# Patient Record
Sex: Male | Born: 1965 | Race: White | Hispanic: No | Marital: Married | Smoking: Current every day smoker
Health system: Southern US, Community
[De-identification: ages and names within clinical notes are randomized; demographics above are authoritative.]

## PROBLEM LIST (undated history)

## (undated) DIAGNOSIS — F329 Major depressive disorder, single episode, unspecified: Secondary | ICD-10-CM

## (undated) DIAGNOSIS — F32A Depression, unspecified: Secondary | ICD-10-CM

## (undated) HISTORY — DX: Major depressive disorder, single episode, unspecified: F32.9

## (undated) HISTORY — PX: OTHER SURGICAL HISTORY: SHX169

## (undated) HISTORY — DX: Depression, unspecified: F32.A

---

## 2006-08-03 ENCOUNTER — Ambulatory Visit: Payer: Self-pay | Admitting: Family Medicine

## 2006-10-02 DIAGNOSIS — F17200 Nicotine dependence, unspecified, uncomplicated: Secondary | ICD-10-CM

## 2009-10-15 ENCOUNTER — Ambulatory Visit: Payer: Self-pay | Admitting: Family Medicine

## 2009-10-15 ENCOUNTER — Encounter: Payer: Self-pay | Admitting: Family Medicine

## 2009-10-15 DIAGNOSIS — F3289 Other specified depressive episodes: Secondary | ICD-10-CM | POA: Insufficient documentation

## 2009-10-15 DIAGNOSIS — F329 Major depressive disorder, single episode, unspecified: Secondary | ICD-10-CM | POA: Insufficient documentation

## 2009-11-16 ENCOUNTER — Ambulatory Visit: Payer: Self-pay | Admitting: Family Medicine

## 2012-09-13 ENCOUNTER — Ambulatory Visit (INDEPENDENT_AMBULATORY_CARE_PROVIDER_SITE_OTHER): Payer: BC Managed Care – PPO | Admitting: Family Medicine

## 2012-09-13 ENCOUNTER — Encounter: Payer: Self-pay | Admitting: Family Medicine

## 2012-09-13 VITALS — BP 150/97 | HR 93 | Wt 201.0 lb

## 2012-09-13 DIAGNOSIS — F17200 Nicotine dependence, unspecified, uncomplicated: Secondary | ICD-10-CM

## 2012-09-13 DIAGNOSIS — Z23 Encounter for immunization: Secondary | ICD-10-CM

## 2012-09-13 DIAGNOSIS — Z720 Tobacco use: Secondary | ICD-10-CM

## 2012-09-13 DIAGNOSIS — Z0000 Encounter for general adult medical examination without abnormal findings: Secondary | ICD-10-CM

## 2012-09-13 DIAGNOSIS — R030 Elevated blood-pressure reading, without diagnosis of hypertension: Secondary | ICD-10-CM

## 2012-09-13 MED ORDER — TRIAMCINOLONE ACETONIDE 0.1 % EX CREA: TOPICAL_CREAM | Freq: Every day | CUTANEOUS | Status: DC | PRN

## 2012-09-13 MED ORDER — VARENICLINE TARTRATE 0.5 MG X 11 & 1 MG X 42 PO MISC: ORAL | Status: DC

## 2012-09-13 MED ORDER — TRIAMCINOLONE ACETONIDE 0.1 % EX CREA: TOPICAL_CREAM | Freq: Two times a day (BID) | CUTANEOUS | Status: DC

## 2012-09-13 NOTE — Progress Notes (Signed)
Subjective:    Patient ID: Paul Osborn, male    DOB: 10-Jul-1966, 46 y.o.   MRN: [id number]  HPI Here for CPE.  No complaints would like to restart Chantix. Started it years ago but didn't finish it.  Tolerated it well.    Review of Systems Comprehensive review of systems is negative.  BP 150/97  Pulse 93  Wt 201 lb (91.173 kg)    No Known Allergies  No past medical history on file.  Past Surgical History  Procedure Date  . Correction of overbite     as a child    History   Social History  . Marital Status: Single    Spouse Name: Idell Pickles     Number of Children: 2  . Years of Education: N/A   Occupational History  . Manager     [employer]   Social History Main Topics  . Smoking status: Current Every Day Smoker -- 1.0 packs/day  . Smokeless tobacco: Not on file  . Alcohol Use: Yes     rare  . Drug Use: No  . Sexually Active: Yes -- Male partner(s)   Other Topics Concern  . Not on file   Social History Narrative   No regular exercise.     Family History  Problem Relation Age of Onset  . Hyperlipidemia Mother   . Breast cancer Mother   . Hypertension Father   . Stroke Father   . Diabetes Father     Outpatient Encounter Prescriptions as of 09/13/2012  Medication Sig Dispense Refill  . triamcinolone cream (KENALOG) 0.1 % Apply topically daily as needed.  30 g  0  . varenicline (CHANTIX STARTING MONTH PAK) 0.5 MG X 11 & 1 MG X 42 tablet Take one 0.5 mg tablet by mouth once daily for 3 days, then increase to one 0.5 mg tablet twice daily for 4 days, then increase to one 1 mg tablet twice daily.  53 tablet  0  . DISCONTD: triamcinolone cream (KENALOG) 0.1 % Apply topically 2 (two) times daily.  30 g  0          Objective:   Physical Exam  Constitutional: He is oriented to person, place, and time. He appears well-developed and well-nourished.  HENT:  Head: Normocephalic and atraumatic.  Right Ear: External ear normal.  Left Ear: External ear  normal.  Nose: Nose normal.  Mouth/Throat: Oropharynx is clear and moist.  Eyes: Conjunctivae normal and EOM are normal. Pupils are equal, round, and reactive to light.  Neck: Normal range of motion. Neck supple. No thyromegaly present.       Thyroid is slighty asymmetric.  Larger on the right compared to left.   Cardiovascular: Normal rate, regular rhythm, normal heart sounds and intact distal pulses.        No carotid bruits/   Pulmonary/Chest: Effort normal and breath sounds normal.  Abdominal: Soft. Bowel sounds are normal. He exhibits no distension and no mass. There is no tenderness. There is no rebound and no guarding.  Musculoskeletal: Normal range of motion.  Lymphadenopathy:    He has no cervical adenopathy.  Neurological: He is alert and oriented to person, place, and time. He has normal reflexes.  Skin: Skin is warm and dry.  Psychiatric: He has a normal mood and affect. His behavior is normal. Judgment and thought content normal.          Assessment & Plan:  CPE Start a regular exercise program and make sure you  are eating a healthy diet Try to eat 4 servings of dairy a day   Tobacco Abuse- will start Chantix. Discussed potential SE. Call if any concerne. Try to complete full 3 months.  Encouraged to use quit program.    Tdap given today.   Elevated blood pressure-his blood pressure is high today. He does have a family history. We discussed eating low salt diet working on regular exercise and weight loss. Followup in 3-4 weeks to repeat blood pressure. At that time if still elevated we discussed that we would need to start a medication.Marland Kitchen Hopefully working on diet and exercise will improve this. Also discussed avoid NSAIDs and caffeine as much as possible as well as decongestants which can also increase blood pressure.

## 2012-09-13 NOTE — Patient Instructions (Addendum)
DASH Diet The DASH diet stands for "Dietary Approaches to Stop Hypertension." It is a healthy eating plan that has been shown to reduce high blood pressure (hypertension) in as little as 14 days, while also possibly providing other significant health benefits. These other health benefits include reducing the risk of breast cancer after menopause and reducing the risk of type 2 diabetes, heart disease, colon cancer, and stroke. Health benefits also include weight loss and slowing kidney failure in patients with chronic kidney disease.   DIET GUIDELINES  Limit salt (sodium). Your diet should contain less than 1500 mg of sodium daily.   Limit refined or processed carbohydrates. Your diet should include mostly whole grains. Desserts and added sugars should be used sparingly.   Include small amounts of heart-healthy fats. These types of fats include nuts, oils, and tub margarine. Limit saturated and trans fats. These fats have been shown to be harmful in the body.  CHOOSING FOODS   The following food groups are based on a 2000 calorie diet. See your Registered Dietitian for individual calorie needs. Grains and Grain Products (6 to 8 servings daily)  Eat More Often: Whole-wheat bread, brown rice, whole-grain or wheat pasta, quinoa, popcorn without added fat or salt (air popped).   Eat Less Often: White bread, white pasta, white rice, cornbread.  Vegetables (4 to 5 servings daily)  Eat More Often: Fresh, frozen, and canned vegetables. Vegetables may be raw, steamed, roasted, or grilled with a minimal amount of fat.   Eat Less Often/Avoid: Creamed or fried vegetables. Vegetables in a cheese sauce.  Fruit (4 to 5 servings daily)  Eat More Often: All fresh, canned (in natural juice), or frozen fruits. Dried fruits without added sugar. One hundred percent fruit juice ( cup [237 mL] daily).   Eat Less Often: Dried fruits with added sugar. Canned fruit in light or heavy syrup.  Lean Meats, Fish, and  Poultry (2 servings or less daily. One serving is 3 to 4 oz [85-114 g]).  Eat More Often: Ninety percent or leaner ground beef, tenderloin, sirloin. Round cuts of beef, chicken breast, turkey breast. All fish. Grill, bake, or broil your meat. Nothing should be fried.   Eat Less Often/Avoid: Fatty cuts of meat, turkey, or chicken leg, thigh, or wing. Fried cuts of meat or fish.  Dairy (2 to 3 servings)  Eat More Often: Low-fat or fat-free milk, low-fat plain or light yogurt, reduced-fat or part-skim cheese.   Eat Less Often/Avoid: Milk (whole, 2%, skim, or chocolate). Whole milk yogurt. Full-fat cheeses.  Nuts, Seeds, and Legumes (4 to 5 servings per week)  Eat More Often: All without added salt.   Eat Less Often/Avoid: Salted nuts and seeds, canned beans with added salt.  Fats and Sweets (limited)  Eat More Often: Vegetable oils, tub margarines without trans fats, sugar-free gelatin. Mayonnaise and salad dressings.   Eat Less Often/Avoid: Coconut oils, palm oils, butter, stick margarine, cream, half and half, cookies, candy, pie.  FOR MORE INFORMATION The Dash Diet Eating Plan: www.dashdiet.org Document Released: 11/30/2011 Document Reviewed: 11/20/2011 ExitCare Patient Information 2012 [employer].1.5 Gram Low Sodium Diet A 1.5 gram sodium diet restricts the amount of sodium in the diet to no more than 1.5 g or 1500 mg daily. The American Heart Association recommends Americans over the age of 20 to consume no more than 1500 mg of sodium each day to reduce the risk of developing high blood pressure. Research also shows that limiting sodium may reduce heart attack   and stroke risk. Many foods contain sodium for flavor and sometimes as a preservative. When the amount of sodium in a diet needs to be low, it is important to know what to look for when choosing foods and drinks. The following includes some information and guidelines to help make it easier for you to adapt to a low sodium  diet. QUICK TIPS  Do not add salt to food.   Avoid convenience items and fast food.   Choose unsalted snack foods.   Buy lower sodium products, often labeled as "lower sodium" or "no salt added."   Check food labels to learn how much sodium is in 1 serving.   When eating at a restaurant, ask that your food be prepared with less salt or none, if possible.  READING FOOD LABELS FOR SODIUM INFORMATION The nutrition facts label is a good place to find how much sodium is in foods. Look for products with no more than 400 mg of sodium per serving. Remember that 1.5 g = 1500 mg. The food label may also list foods as:  Sodium-free: Less than 5 mg in a serving.   Very low sodium: 35 mg or less in a serving.   Low-sodium: 140 mg or less in a serving.   Light in sodium: 50% less sodium in a serving. For example, if a food that usually has 300 mg of sodium is changed to become light in sodium, it will have 150 mg of sodium.   Reduced sodium: 25% less sodium in a serving. For example, if a food that usually has 400 mg of sodium is changed to reduced sodium, it will have 300 mg of sodium.  CHOOSING FOODS Grains  Avoid: Salted crackers and snack items. Some cereals, including instant hot cereals. Bread stuffing and biscuit mixes. Seasoned rice or pasta mixes.   Choose: Unsalted snack items. Low-sodium cereals, oats, puffed wheat and rice, shredded wheat. English muffins and bread. Pasta.  Meats  Avoid: Salted, canned, smoked, spiced, pickled meats, including fish and poultry. Bacon, ham, sausage, cold cuts, hot dogs, anchovies.   Choose: Low-sodium canned tuna and salmon. Fresh or frozen meat, poultry, and fish.  Dairy  Avoid: Processed cheese and spreads. Cottage cheese. Buttermilk and condensed milk. Regular cheese.   Choose: Milk. Low-sodium cottage cheese. Yogurt. Sour cream. Low-sodium cheese.  Fruits and Vegetables  Avoid: Regular canned vegetables. Regular canned tomato sauce and  paste. Frozen vegetables in sauces. Olives. Pickles. Relishes. Sauerkraut.   Choose: Low-sodium canned vegetables. Low-sodium tomato sauce and paste. Frozen or fresh vegetables. Fresh and frozen fruit.  Condiments  Avoid: Canned and packaged gravies. Worcestershire sauce. Tartar sauce. Barbecue sauce. Soy sauce. Steak sauce. Ketchup. Onion, garlic, and table salt. Meat flavorings and tenderizers.   Choose: Fresh and dried herbs and spices. Low-sodium varieties of mustard and ketchup. Lemon juice. Tabasco sauce. Horseradish.  SAMPLE 1.5 GRAM SODIUM MEAL PLAN Breakfast / Sodium (mg)  1 cup low-fat milk / 143 mg   1 whole-wheat English muffin / 240 mg   1 tbs heart-healthy margarine / 153 mg   1 hard-boiled egg / 139 mg   1 small orange / 0 mg  Lunch / Sodium (mg)  1 cup raw carrots / 76 mg   2 tbs no salt added peanut butter / 5 mg   2 slices whole-wheat bread / 270 mg   1 tbs jelly / 6 mg    cup red grapes / 2 mg  Dinner / Sodium (mg)    1 cup whole-wheat pasta / 2 mg   1 cup low-sodium tomato sauce / 73 mg   3 oz lean ground beef / 57 mg   1 small side salad (1 cup raw spinach leaves,  cup cucumber,  cup yellow bell pepper) with 1 tsp olive oil and 1 tsp red wine vinegar / 25 mg  Snack / Sodium (mg)  1 container low-fat vanilla yogurt / 107 mg   3 graham cracker squares / 127 mg  Nutrient Analysis  Calories: 1745   Protein: 75 g   Carbohydrate: 237 g   Fat: 57 g   Sodium: 1425 mg  Document Released: 12/11/2005 Document Revised: 11/30/2011 Document Reviewed: 03/14/2010 ExitCare Patient Information 2012 [employer]. 

## 2012-09-16 LAB — LIPID PANEL
HDL: 38 mg/dL — ABNORMAL LOW (ref >39)
LDL Cholesterol: 147 mg/dL — ABNORMAL HIGH (ref 0–99)
Total CHOL/HDL Ratio: 5.6 Ratio
Triglycerides: 147 mg/dL (ref <150)
VLDL: 29 mg/dL (ref 0–40)

## 2012-09-16 LAB — COMPLETE METABOLIC PANEL WITH GFR
ALT: 29 U/L (ref 0–53)
AST: 19 U/L (ref 0–37)
Chloride: 105 mEq/L (ref 96–112)
Creat: 0.89 mg/dL (ref 0.50–1.35)
Total Bilirubin: 0.5 mg/dL (ref 0.3–1.2)

## 2012-10-04 ENCOUNTER — Encounter: Payer: Self-pay | Admitting: Family Medicine

## 2012-10-04 ENCOUNTER — Ambulatory Visit (INDEPENDENT_AMBULATORY_CARE_PROVIDER_SITE_OTHER): Payer: BC Managed Care – PPO | Admitting: Family Medicine

## 2012-10-04 VITALS — BP 138/81 | HR 81 | Ht 69.0 in | Wt 203.0 lb

## 2012-10-04 DIAGNOSIS — Z720 Tobacco use: Secondary | ICD-10-CM

## 2012-10-04 DIAGNOSIS — F17200 Nicotine dependence, unspecified, uncomplicated: Secondary | ICD-10-CM

## 2012-10-04 DIAGNOSIS — R030 Elevated blood-pressure reading, without diagnosis of hypertension: Secondary | ICD-10-CM

## 2012-10-04 DIAGNOSIS — E785 Hyperlipidemia, unspecified: Secondary | ICD-10-CM

## 2012-10-04 MED ORDER — VARENICLINE TARTRATE 1 MG PO TABS: 1.0000 mg | ORAL_TABLET | Freq: Two times a day (BID) | ORAL | Status: DC

## 2012-10-04 NOTE — Progress Notes (Signed)
  Subjective:    Patient ID: Paul Osborn, male    DOB: February 07, 1966, 46 y.o.   MRN: [id number]  HPI Elevated BP- his blood pressure is elevated a month ago that his physical. We discussed working on low salt diet making some changes and try to get some regular exercise. He says he is really been working hard on dose.  Tob abuse - doing well on the Chantix. He quit smoking about 4 weeks ago. He's do for the continuing packed next week. He's also been using a smart out for his phone to help him quit and says he really likes it and it's been helpful.  Hyperlipidemia - we also reviewed his lab work that he had done for purposes call. We discussed the importance of low-fat low-cholesterol diet in addition to exercise. Recheck lipids in 6 months. He was given a copy of the lab work.  Review of Systems     Objective:   Physical Exam  Constitutional: He is oriented to person, place, and time. He appears well-developed and well-nourished.  HENT:  Head: Normocephalic and atraumatic.  Cardiovascular: Normal rate, regular rhythm and normal heart sounds.   Pulmonary/Chest: Effort normal and breath sounds normal.  Neurological: He is alert and oriented to person, place, and time.  Skin: Skin is warm and dry.  Psychiatric: He has a normal mood and affect. His behavior is normal.          Assessment & Plan:  Elevated blood pressure-recheck today looks great. Still little borderline but much improved from last time. Continue work with diet exercise and low salt diet and recheck in 6 months.  Tobacco abuse-I'm excited he is doing well with this quit program. I did send over a prescription for the continuing packed on the Chantix.  Hyperlipidemia - we also reviewed his lab work that he had done for purposes call. We discussed the importance of low-fat low-cholesterol diet in addition to exercise. Recheck lipids in 6 months. He was given a copy of the lab work.

## 2015-11-09 ENCOUNTER — Encounter: Payer: Self-pay | Admitting: Family Medicine

## 2015-11-09 ENCOUNTER — Ambulatory Visit (INDEPENDENT_AMBULATORY_CARE_PROVIDER_SITE_OTHER): Payer: 59 | Admitting: Family Medicine

## 2015-11-09 VITALS — BP 128/79 | HR 81 | Ht 69.0 in | Wt 198.6 lb

## 2015-11-09 DIAGNOSIS — Z114 Encounter for screening for human immunodeficiency virus [HIV]: Secondary | ICD-10-CM | POA: Diagnosis not present

## 2015-11-09 DIAGNOSIS — Z1211 Encounter for screening for malignant neoplasm of colon: Secondary | ICD-10-CM

## 2015-11-09 DIAGNOSIS — Z1159 Encounter for screening for other viral diseases: Secondary | ICD-10-CM | POA: Diagnosis not present

## 2015-11-09 DIAGNOSIS — Z125 Encounter for screening for malignant neoplasm of prostate: Secondary | ICD-10-CM

## 2015-11-09 DIAGNOSIS — E785 Hyperlipidemia, unspecified: Secondary | ICD-10-CM

## 2015-11-09 DIAGNOSIS — F17200 Nicotine dependence, unspecified, uncomplicated: Secondary | ICD-10-CM

## 2015-11-09 DIAGNOSIS — Z0000 Encounter for general adult medical examination without abnormal findings: Secondary | ICD-10-CM | POA: Diagnosis not present

## 2015-11-09 LAB — COMPLETE METABOLIC PANEL WITH GFR
ALT: 24 U/L (ref 9–46)
AST: 18 U/L (ref 10–35)
Albumin: 4.2 g/dL (ref 3.6–5.1)
Alkaline Phosphatase: 61 U/L (ref 40–115)
BUN: 13 mg/dL (ref 7–25)
CALCIUM: 9.7 mg/dL (ref 8.6–10.3)
CHLORIDE: 104 mmol/L (ref 98–110)
CO2: 26 mmol/L (ref 20–31)
Creat: 0.89 mg/dL (ref 0.70–1.33)
Glucose, Bld: 91 mg/dL (ref 65–99)
POTASSIUM: 4.6 mmol/L (ref 3.5–5.3)
Sodium: 137 mmol/L (ref 135–146)
Total Bilirubin: 0.6 mg/dL (ref 0.2–1.2)
Total Protein: 6.9 g/dL (ref 6.1–8.1)

## 2015-11-09 LAB — LIPID PANEL
CHOL/HDL RATIO: 5.2 ratio — AB (ref <=5.0)
CHOLESTEROL: 208 mg/dL — AB (ref 125–200)
HDL: 40 mg/dL (ref >=40)
LDL CALC: 143 mg/dL — AB (ref <130)
TRIGLYCERIDES: 127 mg/dL (ref <150)
VLDL: 25 mg/dL (ref <30)

## 2015-11-09 LAB — TSH: TSH: 1.285 u[IU]/mL (ref 0.350–4.500)

## 2015-11-09 MED ORDER — VARENICLINE TARTRATE 0.5 MG X 11 & 1 MG X 42 PO MISC: ORAL | Status: DC

## 2015-11-09 NOTE — Progress Notes (Signed)
Subjective:    Patient ID: Paul Osborn, male    DOB: 23-Dec-1966, 49 y.o.   MRN: [id number]  HPI Here for CPE. He is doing well.  He has been exercising some. Was able to get down wo 180 lbs but has gained some of it back.   He is interested in quitting smoking. He wants to try tohe Chantix again. It does help him.    Review of Systems   BP 125/90 mmHg  Pulse 81  Ht 5\' 9"  (1.753 m)  Wt 198 lb 9.6 oz (90.084 kg)  BMI 29.31 kg/m2    No Known Allergies  No past medical history on file.  Past Surgical History  Procedure Laterality Date  . Correction of overbite      as a child    Social History   Social History  . Marital Status: Single    Spouse Name: Asa Lente   . Number of Children: 2  . Years of Education: N/A   Occupational History  . Manager     [employer]   Social History Main Topics  . Smoking status: Current Every Day Smoker -- 1.00 packs/day  . Smokeless tobacco: Not on file  . Alcohol Use: Yes     Comment: rare  . Drug Use: No  . Sexual Activity:    Partners: Female   Other Topics Concern  . Not on file   Social History Narrative   No regular exercise.     Family History  Problem Relation Age of Onset  . Hyperlipidemia Mother   . Breast cancer Mother   . Hypertension Father   . Stroke Father   . Diabetes Father     Outpatient Encounter Prescriptions as of 11/09/2015  Medication Sig  . varenicline (CHANTIX STARTING MONTH PAK) 0.5 MG X 11 & 1 MG X 42 tablet Take one 0.5 mg tablet by mouth once daily for 3 days, then increase to one 0.5 mg tablet twice daily for 4 days, then increase to one 1 mg tablet twice daily.  . [DISCONTINUED] triamcinolone cream (KENALOG) 0.1 % Apply topically daily as needed.  . [DISCONTINUED] varenicline (CHANTIX CONTINUING MONTH PAK) 1 MG tablet Take 1 tablet (1 mg total) by mouth 2 (two) times daily.   No facility-administered encounter medications on file as of 11/09/2015.          Objective:   Physical  Exam  Constitutional: He is oriented to person, place, and time. He appears well-developed and well-nourished.  HENT:  Head: Normocephalic and atraumatic.  Right Ear: External ear normal.  Left Ear: External ear normal.  Nose: Nose normal.  Mouth/Throat: Oropharynx is clear and moist.  Eyes: Conjunctivae and EOM are normal. Pupils are equal, round, and reactive to light.  Neck: Normal range of motion. Neck supple. No thyromegaly present.  Cardiovascular: Normal rate, regular rhythm, normal heart sounds and intact distal pulses.   Pulmonary/Chest: Effort normal and breath sounds normal.  Abdominal: Soft. Bowel sounds are normal. He exhibits no distension and no mass. There is no tenderness. There is no rebound and no guarding.  Musculoskeletal: Normal range of motion.  Lymphadenopathy:    He has no cervical adenopathy.  Neurological: He is alert and oriented to person, place, and time. He has normal reflexes.  Skin: Skin is warm and dry.  Psychiatric: He has a normal mood and affect. His behavior is normal. Judgment and thought content normal.          Assessment & Plan:  CPE Keep up a regular exercise program and make sure you are eating a healthy diet Try to eat 4 servings of dairy a day, or if you are lactose intolerant take a calcium with vitamin D daily.  Your vaccines are up to date.   Tobacco abuse - he is down to 1 PPD. Will refill the Chantix.   Discussed prostate cancer screening.  PSA ordered.

## 2015-11-10 LAB — HEPATITIS C ANTIBODY: HCV Ab: NEGATIVE

## 2015-11-10 LAB — PSA: PSA: 0.66 ng/mL (ref <=4.00)

## 2015-11-10 LAB — HIV ANTIBODY (ROUTINE TESTING W REFLEX): HIV 1&2 Ab, 4th Generation: NONREACTIVE

## 2015-11-17 ENCOUNTER — Telehealth: Payer: Self-pay | Admitting: Family Medicine

## 2015-11-17 NOTE — Telephone Encounter (Signed)
Received fax for prior authorization on Chantix sent through cover my meds waiting on authorization. - CF

## 2015-11-22 NOTE — Telephone Encounter (Signed)
Received fax from [employer] and chantix is denied due to not having a history of failure or contraindication to Bupropion. - CF

## 2015-11-23 ENCOUNTER — Telehealth: Payer: Self-pay | Admitting: Family Medicine

## 2015-11-23 NOTE — Telephone Encounter (Signed)
Please call patient and let them know that Chantix was not covered with his insurance. There are requiring that he has tried and failed bupropion instead. Please see if he has ever taken this drug for smoking cessation.

## 2015-11-24 MED ORDER — BUPROPION HCL ER (SR) 150 MG PO TB12: ORAL_TABLET | ORAL | Status: DC

## 2015-11-24 NOTE — Telephone Encounter (Signed)
Pt states he has taking an antidepressant that you rx'd but doesn't think it was buproprion. Stated that if this is something you think will work for him he's willing to try it.

## 2015-11-24 NOTE — Telephone Encounter (Signed)
Okay, will send over a prescription for bupropion. He will start with 1 a day for the first 3 days and then increase to twice a day. He will need to set his quit date about 2 weeks after he starts the medication and work on weaning down over this 2 weeks. It does take a lot of time for the medication to kick in.  i also encourage him to use a help program such as 1 800 quit now. Studies show that if you use a quit program in conjunction with medication that people are typically more successful.

## 2015-12-02 NOTE — Telephone Encounter (Signed)
Pt was notified of instructions with no questions at this time.

## 2016-02-09 ENCOUNTER — Encounter: Payer: Self-pay | Admitting: Gastroenterology

## 2016-03-27 ENCOUNTER — Ambulatory Visit (AMBULATORY_SURGERY_CENTER): Payer: Self-pay

## 2016-03-27 VITALS — Ht 69.0 in | Wt 201.0 lb

## 2016-03-27 DIAGNOSIS — Z1211 Encounter for screening for malignant neoplasm of colon: Secondary | ICD-10-CM

## 2016-03-27 NOTE — Progress Notes (Signed)
No allergies to eggs or soy No past problems with anesthesia No home oxygen No diet/weight loss meds  Has email and internet; registered emmi 

## 2016-04-05 ENCOUNTER — Encounter: Payer: Self-pay | Admitting: Gastroenterology

## 2016-04-05 ENCOUNTER — Ambulatory Visit (AMBULATORY_SURGERY_CENTER): Payer: 59 | Admitting: Gastroenterology

## 2016-04-05 VITALS — BP 129/85 | HR 70 | Temp 98.0°F | Resp 15 | Ht 69.0 in | Wt 198.0 lb

## 2016-04-05 DIAGNOSIS — D128 Benign neoplasm of rectum: Secondary | ICD-10-CM

## 2016-04-05 DIAGNOSIS — Z1211 Encounter for screening for malignant neoplasm of colon: Secondary | ICD-10-CM

## 2016-04-05 DIAGNOSIS — D124 Benign neoplasm of descending colon: Secondary | ICD-10-CM

## 2016-04-05 MED ORDER — SODIUM CHLORIDE 0.9 % IV SOLN: 500.0000 mL | INTRAVENOUS | Status: DC

## 2016-04-05 NOTE — Op Note (Signed)
[place] Patient Name: Paul Osborn Procedure Date: 04/05/2016 8:51 AM MRN: [id number] Endoscopist: Mallie Mussel L. Danis MD, MD Age: 50 Date of Birth: 03-21-1966 Gender: Male Procedure:                Colonoscopy Indications:              Screening for colorectal malignant neoplasm Medicines:                Monitored Anesthesia Care Procedure:                Pre-Anesthesia Assessment:                           - Prior to the procedure, a History and Physical                            was performed, and patient medications and                            allergies were reviewed. The patient's tolerance of                            previous anesthesia was also reviewed. The risks                            and benefits of the procedure and the sedation                            options and risks were discussed with the patient.                            All questions were answered, and informed consent                            was obtained. Prior Anticoagulants: The patient has                            taken no previous anticoagulant or antiplatelet                            agents. ASA Grade Assessment: II - A patient with                            mild systemic disease. After reviewing the risks                            and benefits, the patient was deemed in                            satisfactory condition to undergo the procedure.                           After obtaining informed consent, the colonoscope  was passed under direct vision. Throughout the                            procedure, the patient's blood pressure, pulse, and                            oxygen saturations were monitored continuously. The                            Model CF-HQ190L [id number]) scope was introduced                            through the anus and advanced to the the cecum,                            identified by appendiceal orifice and ileocecal                       valve. The colonoscopy was performed without                            difficulty. The patient tolerated the procedure                            well. The quality of the bowel preparation was                            excellent. The ileocecal valve, appendiceal                            orifice, and rectum were photographed. The bowel                            preparation used was Miralax. The quality of the                            bowel preparation was evaluated using the BBPS                            [facility] Bowel Preparation Scale) with scores of:                            Right Colon = 2, Transverse Colon = 3 and Left                            Colon = 3. The total BBPS score equals 8. Scope In: 9:06:25 AM Scope Out: 9:27:34 AM Scope Withdrawal Time: 0 hours 16 minutes 1 second  Total Procedure Duration: 0 hours 21 minutes 9 seconds  Findings:                 The perianal and digital rectal examinations were                            normal.  Three sessile polyps were found in the rectum (1)                            and proximal descending colon (2). The polyps were                            4 to 6 mm in size. These polyps were removed with a                            cold snare. Resection and retrieval were complete.                           The exam was otherwise without abnormality on                            direct and retroflexion views. Complications:            No immediate complications. Estimated Blood Loss:     Estimated blood loss was minimal. Impression:               - Three 4 to 6 mm polyps in the rectum and in the                            proximal descending colon, removed with a cold                            snare. Resected and retrieved.                           - The examination was otherwise normal on direct                            and retroflexion views. Recommendation:           - Patient has  a contact number available for                            emergencies. The signs and symptoms of potential                            delayed complications were discussed with the                            patient. Return to normal activities tomorrow.                            Written discharge instructions were provided to the                            patient.                           - Resume previous diet.                           -  Continue present medications.                           - No aspirin, ibuprofen, naproxen, or other                            non-steroidal anti-inflammatory drugs for 5 days                            after polyp removal.                           - Await pathology results.                           - Repeat colonoscopy is recommended for                            surveillance. The colonoscopy date will be                            determined after pathology results from today's                            exam become available for review. Henry L. Danis MD, MD 04/05/2016 9:35:21 AM This report has been signed electronically.

## 2016-04-05 NOTE — Progress Notes (Signed)
Called to room to assist during endoscopic procedure.  Patient ID and intended procedure confirmed with present staff. Received instructions for my participation in the procedure from the performing physician.  

## 2016-04-05 NOTE — Patient Instructions (Addendum)
Impressions/recommendations:  Polyps (handout given)  Please avoid aspirin for the next 5 days.  YOU HAD AN ENDOSCOPIC PROCEDURE TODAY AT [facility]:   Refer to the procedure report that was given to you for any specific questions about what was found during the examination.  If the procedure report does not answer your questions, please call your gastroenterologist to clarify.  If you requested that your care partner not be given the details of your procedure findings, then the procedure report has been included in a sealed envelope for you to review at your convenience later.  YOU SHOULD EXPECT: Some feelings of bloating in the abdomen. Passage of more gas than usual.  Walking can help get rid of the air that was put into your GI tract during the procedure and reduce the bloating. If you had a lower endoscopy (such as a colonoscopy or flexible sigmoidoscopy) you may notice spotting of blood in your stool or on the toilet paper. If you underwent a bowel prep for your procedure, you may not have a normal bowel movement for a few days.  Please Note:  You might notice some irritation and congestion in your nose or some drainage.  This is from the oxygen used during your procedure.  There is no need for concern and it should clear up in a day or so.  SYMPTOMS TO REPORT IMMEDIATELY:   Following lower endoscopy (colonoscopy or flexible sigmoidoscopy):  Excessive amounts of blood in the stool  Significant tenderness or worsening of abdominal pains  Swelling of the abdomen that is new, acute  Fever of 100F or higher   For urgent or emergent issues, a gastroenterologist can be reached at any hour by calling [phone].   DIET: Your first meal following the procedure should be a small meal and then it is ok to progress to your normal diet. Heavy or fried foods are harder to digest and may make you feel nauseous or bloated.  Likewise, meals heavy in dairy and vegetables can  increase bloating.  Drink plenty of fluids but you should avoid alcoholic beverages for 24 hours.  ACTIVITY:  You should plan to take it easy for the rest of today and you should NOT DRIVE or use heavy machinery until tomorrow (because of the sedation medicines used during the test).    FOLLOW UP: Our staff will call the number listed on your records the next business day following your procedure to check on you and address any questions or concerns that you may have regarding the information given to you following your procedure. If we do not reach you, we will leave a message.  However, if you are feeling well and you are not experiencing any problems, there is no need to return our call.  We will assume that you have returned to your regular daily activities without incident.  If any biopsies were taken you will be contacted by phone or by letter within the next 1-3 weeks.  Please call us at [phone] if you have not heard about the biopsies in 3 weeks.    SIGNATURES/CONFIDENTIALITY: You and/or your care partner have signed paperwork which will be entered into your electronic medical record.  These signatures attest to the fact that that the information above on your After Visit Summary has been reviewed and is understood.  Full responsibility of the confidentiality of this discharge information lies with you and/or your care-partner.

## 2016-04-05 NOTE — Progress Notes (Signed)
A/ox3, pleased with MAC, report to RN 

## 2016-04-06 ENCOUNTER — Telehealth: Payer: Self-pay | Admitting: *Deleted

## 2016-04-06 NOTE — Telephone Encounter (Signed)
  Follow up Call-  Call back number 04/05/2016  Post procedure Call Back phone  # [phone]  Permission to leave phone message Yes     Patient questions:  Do you have a fever, pain , or abdominal swelling? No. Pain Score  0 *  Have you tolerated food without any problems? Yes.    Have you been able to return to your normal activities? Yes.    Do you have any questions about your discharge instructions: Diet   No. Medications  No. Follow up visit  No.  Do you have questions or concerns about your Care? No.  Actions: * If pain score is 4 or above: No action needed, pain <4.

## 2016-04-12 ENCOUNTER — Encounter: Payer: Self-pay | Admitting: Gastroenterology

## 2017-03-15 ENCOUNTER — Encounter: Payer: Self-pay | Admitting: Family Medicine

## 2017-03-15 ENCOUNTER — Ambulatory Visit (INDEPENDENT_AMBULATORY_CARE_PROVIDER_SITE_OTHER): Payer: 59 | Admitting: Family Medicine

## 2017-03-15 VITALS — BP 136/88 | HR 79 | Ht 69.0 in | Wt 199.0 lb

## 2017-03-15 DIAGNOSIS — J40 Bronchitis, not specified as acute or chronic: Secondary | ICD-10-CM | POA: Diagnosis not present

## 2017-03-15 DIAGNOSIS — J329 Chronic sinusitis, unspecified: Secondary | ICD-10-CM | POA: Diagnosis not present

## 2017-03-15 MED ORDER — DOXYCYCLINE HYCLATE 100 MG PO TABS: 100.0000 mg | ORAL_TABLET | Freq: Two times a day (BID) | ORAL | 0 refills | Status: DC

## 2017-03-15 NOTE — Progress Notes (Signed)
   Subjective:    Patient ID: Paul Osborn, male    DOB: Oct 25, 1966, 51 y.o.   MRN: [id number]  HPI pt stated that sxs have been going on x1wk he has been taking nyquil. denies fever,sweats,chills. he stated that he has chest congestion, and he has no color to what he coughs up his cough is dry. he did have some facial pain this AM.  He says the cough is worse at night.  No GERD sxs.     Review of Systems     Objective:   Physical Exam  Constitutional: He is oriented to person, place, and time. He appears well-developed and well-nourished.  HENT:  Head: Normocephalic and atraumatic.  Right Ear: External ear normal.  Left Ear: External ear normal.  Nose: Nose normal.  Mouth/Throat: Oropharynx is clear and moist.  TMs and canals are clear.   Eyes: Conjunctivae and EOM are normal. Pupils are equal, round, and reactive to light.  Neck: Neck supple. No thyromegaly present.  Cardiovascular: Normal rate and normal heart sounds.   Pulmonary/Chest: Effort normal.  Diffuse rhonchi.  Some mild exp wheezing at the bases bilaterally.   Lymphadenopathy:    He has no cervical adenopathy.  Neurological: He is alert and oriented to person, place, and time.  Skin: Skin is warm and dry.  Psychiatric: He has a normal mood and affect.          Assessment & Plan:  Sinobronchitis- will tx with doxy. Call if not better in 10 days.  Will get CXR if not getting better.  Can try Mucinex OTC.

## 2017-03-15 NOTE — Patient Instructions (Signed)
Call if not better in 10 days and we will get a chest xray.

## 2017-03-28 ENCOUNTER — Telehealth: Payer: Self-pay

## 2017-03-28 ENCOUNTER — Ambulatory Visit (INDEPENDENT_AMBULATORY_CARE_PROVIDER_SITE_OTHER): Payer: 59

## 2017-03-28 DIAGNOSIS — F17200 Nicotine dependence, unspecified, uncomplicated: Secondary | ICD-10-CM | POA: Diagnosis not present

## 2017-03-28 DIAGNOSIS — R059 Cough, unspecified: Secondary | ICD-10-CM

## 2017-03-28 DIAGNOSIS — I700 Atherosclerosis of aorta: Secondary | ICD-10-CM | POA: Diagnosis not present

## 2017-03-28 DIAGNOSIS — R05 Cough: Secondary | ICD-10-CM

## 2017-03-28 NOTE — Telephone Encounter (Signed)
Pt stated that he has finished his doxy, but feels that he is still having chest congestion and coughing fits. In your noted from his visit, you mentioned a CXR if not better.  Please advise.

## 2017-03-28 NOTE — Telephone Encounter (Signed)
Ok to order and he can go anytime.

## 2017-03-29 ENCOUNTER — Telehealth: Payer: Self-pay | Admitting: Family Medicine

## 2017-03-29 MED ORDER — VARENICLINE TARTRATE 0.5 MG X 11 & 1 MG X 42 PO MISC: ORAL | 0 refills | Status: DC

## 2017-03-29 NOTE — Telephone Encounter (Signed)
Rx sent 

## 2017-03-29 NOTE — Telephone Encounter (Signed)
Ok to call in starter pack.

## 2017-03-29 NOTE — Telephone Encounter (Signed)
Pt would like to try to get Chantix approved again. He is ready to stop smoking. He tried Wellbutrin to stop smoking and it did not help. Pt reports his craving did not decrease and it made him very angry/moody. Pt reports he has taken Chantix in the past with good results. Then life stressors caused him to start back. Will route to PCP for new Rx. With failed Wellbutrin, PA should be able to get approved. [facility] pharmacy on file is correct.

## 2017-04-09 ENCOUNTER — Emergency Department (INDEPENDENT_AMBULATORY_CARE_PROVIDER_SITE_OTHER)
Admission: EM | Admit: 2017-04-09 | Discharge: 2017-04-09 | Disposition: A | Discharge status: Home / Self Care | Payer: 59 | Source: Home / Self Care | Attending: Family Medicine | Admitting: Family Medicine

## 2017-04-09 DIAGNOSIS — H6591 Unspecified nonsuppurative otitis media, right ear: Secondary | ICD-10-CM

## 2017-04-09 MED ORDER — PREDNISONE 20 MG PO TABS: ORAL_TABLET | ORAL | 0 refills | Status: DC

## 2017-04-09 MED ORDER — AMOXICILLIN 875 MG PO TABS: 875.0000 mg | ORAL_TABLET | Freq: Two times a day (BID) | ORAL | 0 refills | Status: DC

## 2017-04-09 NOTE — ED Provider Notes (Signed)
[name]    CSN: [id number] Arrival date & time: 04/09/17  0801     History   Chief Complaint Chief Complaint  Patient presents with  . Ear Fullness    HPI Paul Osborn is a 51 y.o. male.   Patient reports flare-up of seasonal allergies during the past week but has felt well otherwise.  Last night his right ear felt clogged, and he is unable to equalize pressure in his right ear.  He recovered from a respiratory infection about two weeks ago.  No fevers, chills, and sweats.   The history is provided by the patient.    Past Medical History:  Diagnosis Date  . Depression     Patient Active Problem List   Diagnosis Date Noted  . Hyperlipidemia 10/04/2012  . DEPRESSION 10/15/2009  . TOBACCO DEPENDENCE 10/02/2006    Past Surgical History:  Procedure Laterality Date  . correction of overbite     as a child       Home Medications    Prior to Admission medications   Medication Sig Start Date End Date Taking? Authorizing Provider  amoxicillin (AMOXIL) 875 MG tablet Take 1 tablet (875 mg total) by mouth 2 (two) times daily. 04/09/17   Kandra Nicolas, MD  predniSONE (DELTASONE) 20 MG tablet Take one tab by mouth twice daily for 4 days, then one daily for 3 days. Take with food. 04/09/17   Kandra Nicolas, MD  varenicline (CHANTIX STARTING MONTH PAK) 0.5 MG X 11 & 1 MG X 42 tablet Take one 0.5 mg tablet by mouth once daily for 3 days, then increase to one 0.5 mg tablet twice daily for 4 days, then increase to one 1 mg tablet twice daily. 03/29/17   Hali Marry, MD    Family History Family History  Problem Relation Age of Onset  . Hyperlipidemia Mother   . Breast cancer Mother   . Hypertension Mother   . Alzheimer's disease Mother   . Hypertension Father   . Stroke Father   . Diabetes Father   . Colon cancer Maternal Grandmother     Social History Social History  Substance Use Topics  . Smoking status: Current Every Day Smoker   Packs/day: 1.00  . Smokeless tobacco: Not on file  . Alcohol use Yes     Comment: rare     Allergies   Patient has no known allergies.   Review of Systems Review of Systems No sore throat + occasional cough No pleuritic pain No wheezing + nasal congestion + post-nasal drainage No sinus pain/pressure No itchy/red eyes + right earache No hemoptysis No SOB No fever/chills No nausea No vomiting No abdominal pain No diarrhea No urinary symptoms No skin rash No fatigue No myalgias No headache Used OTC meds without relief   Physical Exam Triage Vital Signs ED Triage Vitals  Enc Vitals Group     BP 04/09/17 0824 (!) 153/100     Pulse Rate 04/09/17 0824 98     Resp --      Temp --      Temp Source 04/09/17 0824 Oral     SpO2 04/09/17 0824 93 %     Weight 04/09/17 0824 196 lb (88.9 kg)     Height 04/09/17 0824 5\' 9"  (1.753 m)     Head Circumference --      Peak Flow --      Pain Score 04/09/17 0825 0     Pain Loc --  Pain Edu? --      Excl. in [place]? --    No data found.   Updated Vital Signs BP (!) 153/100 (BP Location: Left Arm)   Pulse 98   Ht 5\' 9"  (1.753 m)   Wt 196 lb (88.9 kg)   SpO2 93%   BMI 28.94 kg/m   Visual Acuity Right Eye Distance:   Left Eye Distance:   Bilateral Distance:    Right Eye Near:   Left Eye Near:    Bilateral Near:     Physical Exam Nursing notes and Vital Signs reviewed. Appearance:  Patient appears stated age, and in no acute distress Eyes:  Pupils are equal, round, and reactive to light and accomodation.  Extraocular movement is intact.  Conjunctivae are not inflamed  Ears:  Canals normal.  Right tympanic membrane erythematous and bulging.  Left tympanic membrane and canal normal. Nose:  Mildly congested turbinates.  No sinus tenderness.   Pharynx:  Normal Neck:  Supple.  No adenopathy.      UC Treatments / Results  Labs (all labs ordered are listed, but only abnormal results are displayed) Labs Reviewed -  No data to display  EKG  EKG Interpretation None       Radiology No results found.  Procedures Procedures (including critical care time)  Medications Ordered in UC Medications - No data to display   Initial Impression / Assessment and Plan / UC Course  I have reviewed the triage vital signs and the nursing notes.  Pertinent labs & imaging results that were available during my care of the patient were reviewed by me and considered in my medical decision making (see chart for details).    Begin amoxicillin, and prednisone burst/taper. May use Afrin nasal spray (or generic oxymetazoline) each morning for about 5 days and then discontinue.  Also recommend using saline nasal spray several times daily and saline nasal irrigation (AYR is a common brand).  Use Flonase nasal spray each morning after using Afrin nasal spray and saline nasal irrigation. Stop all antihistamines for now, and other non-prescription cough/cold preparations. Followup with Family Doctor if not improved in one week.     Final Clinical Impressions(s) / UC Diagnoses   Final diagnoses:  Right otitis media with effusion    New Prescriptions New Prescriptions   AMOXICILLIN (AMOXIL) 875 MG TABLET    Take 1 tablet (875 mg total) by mouth 2 (two) times daily.   PREDNISONE (DELTASONE) 20 MG TABLET    Take one tab by mouth twice daily for 4 days, then one daily for 3 days. Take with food.     Kandra Nicolas, MD 04/09/17 [phone]

## 2017-04-09 NOTE — ED Triage Notes (Signed)
Pt stated that ear became "clogged last night.  Denies pain.

## 2017-04-09 NOTE — Discharge Instructions (Signed)
May use Afrin nasal spray (or generic oxymetazoline) each morning for about 5 days and then discontinue.  Also recommend using saline nasal spray several times daily and saline nasal irrigation (AYR is a common brand).  Use Flonase nasal spray each morning after using Afrin nasal spray and saline nasal irrigation. Stop all antihistamines for now, and other non-prescription cough/cold preparations.

## 2017-04-11 ENCOUNTER — Telehealth: Payer: Self-pay | Admitting: *Deleted

## 2017-04-11 NOTE — Telephone Encounter (Signed)
chantix starting month pak approved. Message left on patient vm. Pharm notified.approved for coverage until 04/11/2018

## 2017-04-11 NOTE — Telephone Encounter (Signed)
Pre Authorization sent to cover my meds. [id number]

## 2017-05-10 ENCOUNTER — Other Ambulatory Visit: Payer: Self-pay | Admitting: *Deleted

## 2017-05-10 MED ORDER — VARENICLINE TARTRATE 1 MG PO TABS: 1.0000 mg | ORAL_TABLET | Freq: Two times a day (BID) | ORAL | 1 refills | Status: DC

## 2019-08-26 ENCOUNTER — Ambulatory Visit (INDEPENDENT_AMBULATORY_CARE_PROVIDER_SITE_OTHER): Payer: 59 | Admitting: Family Medicine

## 2019-08-26 ENCOUNTER — Encounter: Payer: Self-pay | Admitting: Family Medicine

## 2019-08-26 ENCOUNTER — Other Ambulatory Visit: Payer: Self-pay

## 2019-08-26 ENCOUNTER — Telehealth: Payer: Self-pay | Admitting: Family Medicine

## 2019-08-26 ENCOUNTER — Ambulatory Visit (INDEPENDENT_AMBULATORY_CARE_PROVIDER_SITE_OTHER): Payer: 59

## 2019-08-26 VITALS — BP 158/102 | HR 92 | Temp 98.0°F | Ht 69.0 in | Wt 209.0 lb

## 2019-08-26 DIAGNOSIS — Z23 Encounter for immunization: Secondary | ICD-10-CM | POA: Diagnosis not present

## 2019-08-26 DIAGNOSIS — M79644 Pain in right finger(s): Secondary | ICD-10-CM

## 2019-08-26 DIAGNOSIS — M65319 Trigger thumb, unspecified thumb: Secondary | ICD-10-CM

## 2019-08-26 DIAGNOSIS — Z125 Encounter for screening for malignant neoplasm of prostate: Secondary | ICD-10-CM | POA: Diagnosis not present

## 2019-08-26 DIAGNOSIS — I10 Essential (primary) hypertension: Secondary | ICD-10-CM | POA: Diagnosis not present

## 2019-08-26 DIAGNOSIS — F17200 Nicotine dependence, unspecified, uncomplicated: Secondary | ICD-10-CM

## 2019-08-26 MED ORDER — CHANTIX STARTING MONTH PAK 0.5 MG X 11 & 1 MG X 42 PO TABS: ORAL_TABLET | ORAL | 0 refills | Status: DC

## 2019-08-26 NOTE — Assessment & Plan Note (Signed)
We will try Chantix again hopefully his insurance will cover it.  He really feels very motivated this time.  Also encouraged him to utilize a quit program along with the medication.  He can either do it through the Chantix company or [phone] quit now.

## 2019-08-26 NOTE — Assessment & Plan Note (Signed)
New diagnosis.  Will start HCTZ 25 mg daily.  I discussed that it is a diuretic and to monitor for increased urinary frequency.  Follow-up in 1 month for blood pressure check and BMP.

## 2019-08-26 NOTE — Progress Notes (Addendum)
Established Patient Office Visit  Subjective:  Patient ID: Paul Osborn, male    DOB: [birth year]  Age: 53 y.o. MRN: [id number]  CC:  Chief Complaint  Patient presents with  . Arthritis    bilateral hands, R index hard to bend and R thumb is trigger  . Nicotine Dependence    HPI Paul Osborn presents problems with high blood pressure.  He has never been previously diagnosed or treated.  He has a family history in his dad.  He is still smoking and would like to consider doing Chantix to help him quit again he had taken it previously but had problems getting it covered by his insurance.  He also complains of pain in both hands particularly his right index finger.  He says that it is quite stiff.  He says it started really hurting about a year ago.  Now it is not nearly as bothersome in regards to pain but now he cannot fully flex it.  He denies any specific injury or trauma or old fractures.  Is also noted that he has been considered getting some triggering in his right thumb at times.    He is actually planning on going to a trip to [place] next year to walk 500 miles across the country.   Past Medical History:  Diagnosis Date  . Depression     Past Surgical History:  Procedure Laterality Date  . correction of overbite     as a child    Family History  Problem Relation Age of Onset  . Hyperlipidemia Mother   . Breast cancer Mother   . Hypertension Mother   . Alzheimer's disease Mother   . Hypertension Father   . Stroke Father   . Diabetes Father   . Colon cancer Maternal Grandmother     Social History   Socioeconomic History  . Marital status: Married    Spouse name: Asa Lente   . Number of children: 2  . Years of education: Not on file  . Highest education level: Not on file  Occupational History  . Occupation: Freight forwarder    Comment: [employer]  . Financial resource strain: Not on file  . Food insecurity    Worry: Not on file    Inability: Not  on file  . Transportation needs    Medical: Not on file    Non-medical: Not on file  Tobacco Use  . Smoking status: Current Every Day Smoker    Packs/day: 1.00  . Smokeless tobacco: Never Used  Substance and Sexual Activity  . Alcohol use: Yes    Comment: rare  . Drug use: No  . Sexual activity: Yes    Partners: Female  Lifestyle  . Physical activity    Days per week: Not on file    Minutes per session: Not on file  . Stress: Not on file  Relationships  . Social Herbalist on phone: Not on file    Gets together: Not on file    Attends religious service: Not on file    Active member of club or organization: Not on file    Attends meetings of clubs or organizations: Not on file    Relationship status: Not on file  . Intimate partner violence    Fear of current or ex partner: Not on file    Emotionally abused: Not on file    Physically abused: Not on file    Forced sexual activity: Not  on file  Other Topics Concern  . Not on file  Social History Narrative   No regular exercise.     Outpatient Medications Prior to Visit  Medication Sig Dispense Refill  . amoxicillin (AMOXIL) 875 MG tablet Take 1 tablet (875 mg total) by mouth 2 (two) times daily. 14 tablet 0  . predniSONE (DELTASONE) 20 MG tablet Take one tab by mouth twice daily for 4 days, then one daily for 3 days. Take with food. 11 tablet 0  . varenicline (CHANTIX CONTINUING MONTH PAK) 1 MG tablet Take 1 tablet (1 mg total) by mouth 2 (two) times daily. 28 tablet 1  . varenicline (CHANTIX STARTING MONTH PAK) 0.5 MG X 11 & 1 MG X 42 tablet Take one 0.5 mg tablet by mouth once daily for 3 days, then increase to one 0.5 mg tablet twice daily for 4 days, then increase to one 1 mg tablet twice daily. 53 tablet 0   No facility-administered medications prior to visit.     No Known Allergies  ROS Review of Systems  Constitutional: Negative for diaphoresis, fever and unexpected weight change.  HENT: Negative for  hearing loss, rhinorrhea, sneezing and tinnitus.   Eyes: Negative for visual disturbance.  Respiratory: Negative for cough and wheezing.   Cardiovascular: Negative for chest pain and palpitations.  Gastrointestinal: Negative for blood in stool, diarrhea, nausea and vomiting.  Genitourinary: Negative for discharge and dysuria.  Musculoskeletal: Positive for arthralgias. Negative for myalgias.  Skin: Negative for rash.  Neurological: Negative for headaches.  Hematological: Negative for adenopathy.  Psychiatric/Behavioral: Negative for dysphoric mood and sleep disturbance. The patient is not nervous/anxious.       Objective:    Physical Exam  Constitutional: He is oriented to person, place, and time. He appears well-developed and well-nourished.  HENT:  Head: Normocephalic and atraumatic.  Eyes: Conjunctivae are normal.  Neck: Neck supple. No thyromegaly present.  Cardiovascular: Normal rate, regular rhythm and normal heart sounds.  No carotid bruits.  Pulmonary/Chest: Effort normal and breath sounds normal.  Musculoskeletal:     Comments: Unable to fully flex the right index finger nontender over the joints themselves.  No swelling or bogginess.  He is able to demonstrate the triggering in his right thumb.  Lymphadenopathy:    He has no cervical adenopathy.  Neurological: He is alert and oriented to person, place, and time.  Skin: Skin is warm and dry.  Psychiatric: He has a normal mood and affect. His behavior is normal.    BP (!) 158/102   Pulse 92   Temp 98 F (36.7 C)   Ht 5\' 9"  (1.753 m)   Wt 209 lb (94.8 kg)   SpO2 97%   BMI 30.86 kg/m  Wt Readings from Last 3 Encounters:  08/26/19 209 lb (94.8 kg)  04/09/17 196 lb (88.9 kg)  03/15/17 199 lb (90.3 kg)     Health Maintenance Due  Topic Date Due  . INFLUENZA VACCINE  07/26/2019    There are no preventive care reminders to display for this patient.  Lab Results  Component Value Date   TSH 1.285 11/09/2015    No results found for: WBC, HGB, HCT, MCV, PLT Lab Results  Component Value Date   NA 137 11/09/2015   K 4.6 11/09/2015   CO2 26 11/09/2015   GLUCOSE 91 11/09/2015   BUN 13 11/09/2015   CREATININE 0.89 11/09/2015   BILITOT 0.6 11/09/2015   ALKPHOS 61 11/09/2015   AST 18 11/09/2015  ALT 24 11/09/2015   PROT 6.9 11/09/2015   ALBUMIN 4.2 11/09/2015   CALCIUM 9.7 11/09/2015   Lab Results  Component Value Date   CHOL 208 (H) 11/09/2015   Lab Results  Component Value Date   HDL 40 11/09/2015   Lab Results  Component Value Date   LDLCALC 143 (H) 11/09/2015   Lab Results  Component Value Date   TRIG 127 11/09/2015   Lab Results  Component Value Date   CHOLHDL 5.2 (H) 11/09/2015   No results found for: HGBA1C    Assessment & Plan:   Problem List Items Addressed This Visit      Cardiovascular and Mediastinum   Essential hypertension    New diagnosis.  Will start HCTZ 25 mg daily.  I discussed that it is a diuretic and to monitor for increased urinary frequency.  Follow-up in 1 month for blood pressure check and BMP.      Relevant Orders   CBC   COMPLETE METABOLIC PANEL WITH GFR   Lipid panel   TSH   PSA     Other   TOBACCO DEPENDENCE    We will try Chantix again hopefully his insurance will cover it.  He really feels very motivated this time.  Also encouraged him to utilize a quit program along with the medication.  He can either do it through the Chantix company or [phone] quit now.      Relevant Medications   varenicline (CHANTIX STARTING MONTH PAK) 0.5 MG X 11 & 1 MG X 42 tablet    Other Visit Diagnoses    Finger pain, right    -  Primary   Relevant Orders   DG Finger Index Right (Completed)   Acquired trigger thumb       Screening for prostate cancer       Relevant Orders   PSA      Trigger thumb-recommend that he get an appointment with sports medicine for more definitive treatment which is typically an injection we discussed this  today.  Pain and stiffness in the right index finger he does not remember any old trauma or injury but will get x-ray today for further work-up.  Meds ordered this encounter  Medications  . varenicline (CHANTIX STARTING MONTH PAK) 0.5 MG X 11 & 1 MG X 42 tablet    Sig: Take one 0.5 mg tablet by mouth once daily for 3 days, then increase to one 0.5 mg tablet twice daily for 4 days, then increase to one 1 mg tablet twice daily.    Dispense:  53 tablet    Refill:  0    Follow-up: Return in about 4 weeks (around 09/23/2019) for  for blood pressure check and BMP.Beatrice Lecher, MD

## 2019-08-26 NOTE — Telephone Encounter (Signed)
Received fax from [employer] that Chantix requires a PA. Information has been sent to the insurance company. Awaiting determination.

## 2019-08-27 ENCOUNTER — Encounter: Payer: Self-pay | Admitting: Family Medicine

## 2019-08-30 LAB — COMPLETE METABOLIC PANEL WITH GFR
AG Ratio: 1.8 (calc) (ref 1.0–2.5)
ALT: 23 U/L (ref 9–46)
AST: 19 U/L (ref 10–35)
Albumin: 4.5 g/dL (ref 3.6–5.1)
Alkaline phosphatase (APISO): 69 U/L (ref 35–144)
BUN: 16 mg/dL (ref 7–25)
CO2: 25 mmol/L (ref 20–32)
Calcium: 10 mg/dL (ref 8.6–10.3)
Chloride: 104 mmol/L (ref 98–110)
Creat: 0.87 mg/dL (ref 0.70–1.33)
GFR, Est African American: 113 mL/min/{1.73_m2} (ref >=60)
GFR, Est Non African American: 97 mL/min/{1.73_m2} (ref >=60)
Globulin: 2.5 g/dL (calc) (ref 1.9–3.7)
Glucose, Bld: 108 mg/dL — ABNORMAL HIGH (ref 65–99)
Potassium: 4.4 mmol/L (ref 3.5–5.3)
Sodium: 137 mmol/L (ref 135–146)
Total Bilirubin: 0.5 mg/dL (ref 0.2–1.2)
Total Protein: 7 g/dL (ref 6.1–8.1)

## 2019-08-30 LAB — LIPID PANEL
Cholesterol: 224 mg/dL — ABNORMAL HIGH (ref <200)
HDL: 38 mg/dL — ABNORMAL LOW (ref >=40)
LDL Cholesterol (Calc): 152 mg/dL (calc) — ABNORMAL HIGH
Non-HDL Cholesterol (Calc): 186 mg/dL (calc) — ABNORMAL HIGH (ref <130)
Total CHOL/HDL Ratio: 5.9 (calc) — ABNORMAL HIGH (ref <5.0)
Triglycerides: 196 mg/dL — ABNORMAL HIGH (ref <150)

## 2019-08-30 LAB — CBC
HCT: 50.2 % — ABNORMAL HIGH (ref 38.5–50.0)
Hemoglobin: 17 g/dL (ref 13.2–17.1)
MCH: 30 pg (ref 27.0–33.0)
MCHC: 33.9 g/dL (ref 32.0–36.0)
MCV: 88.7 fL (ref 80.0–100.0)
MPV: 9.6 fL (ref 7.5–12.5)
Platelets: 406 10*3/uL — ABNORMAL HIGH (ref 140–400)
RBC: 5.66 10*6/uL (ref 4.20–5.80)
RDW: 13.1 % (ref 11.0–15.0)
WBC: 8.1 10*3/uL (ref 3.8–10.8)

## 2019-08-30 LAB — TSH: TSH: 2.64 mIU/L (ref 0.40–4.50)

## 2019-08-30 LAB — PSA: PSA: 0.5 ng/mL (ref <=4.0)

## 2019-09-03 ENCOUNTER — Other Ambulatory Visit: Payer: Self-pay | Admitting: *Deleted

## 2019-09-03 MED ORDER — HYDROCHLOROTHIAZIDE 25 MG PO TABS: 25.0000 mg | ORAL_TABLET | Freq: Every day | ORAL | 0 refills | Status: DC

## 2019-09-03 MED ORDER — ATORVASTATIN CALCIUM 20 MG PO TABS: 20.0000 mg | ORAL_TABLET | Freq: Every day | ORAL | 3 refills | Status: DC

## 2019-09-03 NOTE — Telephone Encounter (Signed)
Approvedon September 3 (Chantix) Request Reference Number: [id number]. CHANTIX PAK 0.5& 1MG  is approved through 08/25/2020. For further questions, call [phone].Pharmacy aware.

## 2019-09-03 NOTE — Addendum Note (Signed)
Addended by: Beatrice Lecher D on: 09/03/2019 07:48 AM   Modules accepted: Orders

## 2019-09-26 ENCOUNTER — Encounter: Payer: Self-pay | Admitting: Family Medicine

## 2019-09-26 ENCOUNTER — Other Ambulatory Visit: Payer: Self-pay

## 2019-09-26 ENCOUNTER — Ambulatory Visit (INDEPENDENT_AMBULATORY_CARE_PROVIDER_SITE_OTHER): Payer: 59 | Admitting: Family Medicine

## 2019-09-26 VITALS — BP 133/88 | HR 85 | Ht 69.0 in | Wt 206.0 lb

## 2019-09-26 DIAGNOSIS — I10 Essential (primary) hypertension: Secondary | ICD-10-CM | POA: Diagnosis not present

## 2019-09-26 DIAGNOSIS — R351 Nocturia: Secondary | ICD-10-CM

## 2019-09-26 DIAGNOSIS — F17200 Nicotine dependence, unspecified, uncomplicated: Secondary | ICD-10-CM

## 2019-09-26 MED ORDER — HYDROCHLOROTHIAZIDE 25 MG PO TABS: 25.0000 mg | ORAL_TABLET | Freq: Every day | ORAL | 1 refills | Status: DC

## 2019-09-26 MED ORDER — VARENICLINE TARTRATE 1 MG PO TABS: 1.0000 mg | ORAL_TABLET | Freq: Two times a day (BID) | ORAL | 1 refills | Status: DC

## 2019-09-26 NOTE — Assessment & Plan Note (Signed)
He has cut back but not fully stopped. He is still working at it. Wants continuing pack sent.

## 2019-09-26 NOTE — Progress Notes (Signed)
Established Patient Office Visit  Subjective:  Patient ID: Paul Osborn, male    DOB: [birth year]  Age: 53 y.o. MRN: [id number]  CC:  Chief Complaint  Patient presents with  . Hypertension    HPI Nagee Prey Bignell presents for follow-up of new diagnosis of hypertension and new start of blood pressure medication.  He has been on it for a month now.  He has been tolerating it well without any problems or side effects he feels good on it.  He said he actually hiked 25 miles last week and is actually lost a couple of pounds.  He has noticed that he has been urinating a little bit more at night.  Normally he would get up about once and the night and now is been getting up twice.  He does try to limit fluids right before bedtime.  No dysuria or hematuria.  Past Medical History:  Diagnosis Date  . Depression     Past Surgical History:  Procedure Laterality Date  . correction of overbite     as a child    Family History  Problem Relation Age of Onset  . Hyperlipidemia Mother   . Breast cancer Mother   . Hypertension Mother   . Alzheimer's disease Mother   . Hypertension Father   . Stroke Father   . Diabetes Father   . Colon cancer Maternal Grandmother     Social History   Socioeconomic History  . Marital status: Married    Spouse name: Asa Lente   . Number of children: 2  . Years of education: Not on file  . Highest education level: Not on file  Occupational History  . Occupation: Freight forwarder    Comment: [employer]  . Financial resource strain: Not on file  . Food insecurity    Worry: Not on file    Inability: Not on file  . Transportation needs    Medical: Not on file    Non-medical: Not on file  Tobacco Use  . Smoking status: Current Every Day Smoker    Packs/day: 1.00  . Smokeless tobacco: Never Used  Substance and Sexual Activity  . Alcohol use: Yes    Comment: rare  . Drug use: No  . Sexual activity: Yes    Partners: Female  Lifestyle  .  Physical activity    Days per week: Not on file    Minutes per session: Not on file  . Stress: Not on file  Relationships  . Social Herbalist on phone: Not on file    Gets together: Not on file    Attends religious service: Not on file    Active member of club or organization: Not on file    Attends meetings of clubs or organizations: Not on file    Relationship status: Not on file  . Intimate partner violence    Fear of current or ex partner: Not on file    Emotionally abused: Not on file    Physically abused: Not on file    Forced sexual activity: Not on file  Other Topics Concern  . Not on file  Social History Narrative   No regular exercise.     Outpatient Medications Prior to Visit  Medication Sig Dispense Refill  . atorvastatin (LIPITOR) 20 MG tablet Take 1 tablet (20 mg total) by mouth daily. 90 tablet 3  . hydrochlorothiazide (HYDRODIURIL) 25 MG tablet Take 1 tablet (25 mg total) by mouth daily. Please  schedule an appointment w/PCP and labs in 3 weeks 30 tablet 0  . varenicline (CHANTIX STARTING MONTH PAK) 0.5 MG X 11 & 1 MG X 42 tablet Take one 0.5 mg tablet by mouth once daily for 3 days, then increase to one 0.5 mg tablet twice daily for 4 days, then increase to one 1 mg tablet twice daily. 53 tablet 0   No facility-administered medications prior to visit.     No Known Allergies  ROS Review of Systems    Objective:    Physical Exam  Constitutional: He is oriented to person, place, and time. He appears well-developed and well-nourished.  HENT:  Head: Normocephalic and atraumatic.  Cardiovascular: Normal rate, regular rhythm and normal heart sounds.  Pulmonary/Chest: Effort normal and breath sounds normal.  Neurological: He is alert and oriented to person, place, and time.  Skin: Skin is warm and dry.  Psychiatric: He has a normal mood and affect. His behavior is normal.    BP 133/88   Pulse 85   Ht 5\' 9"  (1.753 m)   Wt 206 lb (93.4 kg)    SpO2 99%   BMI 30.42 kg/m  Wt Readings from Last 3 Encounters:  09/26/19 206 lb (93.4 kg)  08/26/19 209 lb (94.8 kg)  04/09/17 196 lb (88.9 kg)     There are no preventive care reminders to display for this patient.  There are no preventive care reminders to display for this patient.  Lab Results  Component Value Date   TSH 2.64 08/29/2019   Lab Results  Component Value Date   WBC 8.1 08/29/2019   HGB 17.0 08/29/2019   HCT 50.2 (H) 08/29/2019   MCV 88.7 08/29/2019   PLT 406 (H) 08/29/2019   Lab Results  Component Value Date   NA 137 08/29/2019   K 4.4 08/29/2019   CO2 25 08/29/2019   GLUCOSE 108 (H) 08/29/2019   BUN 16 08/29/2019   CREATININE 0.87 08/29/2019   BILITOT 0.5 08/29/2019   ALKPHOS 61 11/09/2015   AST 19 08/29/2019   ALT 23 08/29/2019   PROT 7.0 08/29/2019   ALBUMIN 4.2 11/09/2015   CALCIUM 10.0 08/29/2019   Lab Results  Component Value Date   CHOL 224 (H) 08/29/2019   Lab Results  Component Value Date   HDL 38 (L) 08/29/2019   Lab Results  Component Value Date   LDLCALC 152 (H) 08/29/2019   Lab Results  Component Value Date   TRIG 196 (H) 08/29/2019   Lab Results  Component Value Date   CHOLHDL 5.9 (H) 08/29/2019   No results found for: HGBA1C    Assessment & Plan:   Problem List Items Addressed This Visit      Cardiovascular and Mediastinum   Essential hypertension - Primary    Well controlled. Continue current regimen. Follow up in  6 months. Keep up the exercise. Great job on the weight loss.       Relevant Medications   hydrochlorothiazide (HYDRODIURIL) 25 MG tablet   Other Relevant Orders   BASIC METABOLIC PANEL WITH GFR     Other   TOBACCO DEPENDENCE    He has cut back but not fully stopped. He is still working at it. Wants continuing pack sent.         Other Visit Diagnoses    Nocturia         Nocturia-certainly could be a side effect of the diuretic that he takes that in the morning so would be  a little bit  unusual.  It may also be a sign of some enlarged prostate.  He can try adjusting the timing of the diuretic and see if that makes a difference and if not we can work-up BPH later.  Recent PSA was normal.  Meds ordered this encounter  Medications  . varenicline (CHANTIX CONTINUING MONTH PAK) 1 MG tablet    Sig: Take 1 tablet (1 mg total) by mouth 2 (two) times daily.    Dispense:  60 tablet    Refill:  1  . hydrochlorothiazide (HYDRODIURIL) 25 MG tablet    Sig: Take 1 tablet (25 mg total) by mouth daily.    Dispense:  90 tablet    Refill:  1    Follow-up: Return in about 6 months (around 03/26/2020) for Hypertension.    Beatrice Lecher, MD

## 2019-09-26 NOTE — Assessment & Plan Note (Signed)
Well controlled. Continue current regimen. Follow up in  6 months. Keep up the exercise. Great job on the weight loss.

## 2019-09-27 LAB — BASIC METABOLIC PANEL WITH GFR
BUN: 14 mg/dL (ref 7–25)
CO2: 28 mmol/L (ref 20–32)
Calcium: 10.3 mg/dL (ref 8.6–10.3)
Chloride: 100 mmol/L (ref 98–110)
Creat: 0.97 mg/dL (ref 0.70–1.33)
GFR, Est African American: 101 mL/min/{1.73_m2} (ref >=60)
GFR, Est Non African American: 88 mL/min/{1.73_m2} (ref >=60)
Glucose, Bld: 85 mg/dL (ref 65–99)
Potassium: 3.6 mmol/L (ref 3.5–5.3)
Sodium: 138 mmol/L (ref 135–146)

## 2019-09-29 NOTE — Progress Notes (Signed)
All labs are normal. 

## 2020-02-29 ENCOUNTER — Ambulatory Visit: Payer: 59 | Attending: Internal Medicine

## 2020-02-29 DIAGNOSIS — Z23 Encounter for immunization: Secondary | ICD-10-CM | POA: Insufficient documentation

## 2020-02-29 NOTE — Progress Notes (Signed)
   Covid-19 Vaccination Clinic  Name:  Paul Osborn    MRN: [id number] DOB: July 22, 1966  02/29/2020  Paul Osborn was observed post Covid-19 immunization for 15 minutes without incident. He was provided with Vaccine Information Sheet and instruction to access the V-Safe system.   Paul Osborn was instructed to call 911 with any severe reactions post vaccine: Marland Kitchen Difficulty breathing  . Swelling of face and throat  . A fast heartbeat  . A bad rash all over body  . Dizziness and weakness   Immunizations Administered    Name Date Dose VIS Date Route   Pfizer COVID-19 Vaccine 02/29/2020  6:41 PM 0.3 mL 12/05/2019 Intramuscular   Manufacturer: [place]   Lot: [id number]   [place]: [id number]

## 2020-03-31 ENCOUNTER — Ambulatory Visit: Payer: 59 | Attending: Internal Medicine

## 2020-03-31 DIAGNOSIS — Z23 Encounter for immunization: Secondary | ICD-10-CM

## 2020-03-31 NOTE — Progress Notes (Signed)
   Covid-19 Vaccination Clinic  Name:  Paul Osborn    MRN: [id number] DOB: 01/26/1966  03/31/2020  [name] was observed post Covid-19 immunization for 15 minutes without incident. He was provided with Vaccine Information Sheet and instruction to access the V-Safe system.   [name] was instructed to call 911 with any severe reactions post vaccine: Marland Kitchen Difficulty breathing  . Swelling of face and throat  . A fast heartbeat  . A bad rash all over body  . Dizziness and weakness   Immunizations Administered    Name Date Dose VIS Date Route   Pfizer COVID-19 Vaccine 03/31/2020  9:45 AM 0.3 mL 12/05/2019 Intramuscular   Manufacturer: [employer]   Lot: [id number]   [place]: [id number]

## 2020-04-07 ENCOUNTER — Ambulatory Visit: Payer: 59 | Admitting: Family Medicine

## 2020-04-12 ENCOUNTER — Ambulatory Visit (INDEPENDENT_AMBULATORY_CARE_PROVIDER_SITE_OTHER): Payer: 59 | Admitting: Family Medicine

## 2020-04-12 ENCOUNTER — Other Ambulatory Visit: Payer: Self-pay

## 2020-04-12 ENCOUNTER — Encounter: Payer: Self-pay | Admitting: Family Medicine

## 2020-04-12 VITALS — BP 134/87 | HR 76 | Ht 69.0 in | Wt 212.0 lb

## 2020-04-12 DIAGNOSIS — I10 Essential (primary) hypertension: Secondary | ICD-10-CM

## 2020-04-12 DIAGNOSIS — E785 Hyperlipidemia, unspecified: Secondary | ICD-10-CM | POA: Diagnosis not present

## 2020-04-12 LAB — LIPID PANEL
Cholesterol: 156 mg/dL (ref <200)
HDL: 38 mg/dL — ABNORMAL LOW (ref >=40)
LDL Cholesterol (Calc): 95 mg/dL (calc)
Non-HDL Cholesterol (Calc): 118 mg/dL (calc) (ref <130)
Total CHOL/HDL Ratio: 4.1 (calc) (ref <5.0)
Triglycerides: 131 mg/dL (ref <150)

## 2020-04-12 LAB — COMPLETE METABOLIC PANEL WITH GFR
AG Ratio: 1.6 (calc) (ref 1.0–2.5)
ALT: 33 U/L (ref 9–46)
AST: 20 U/L (ref 10–35)
Albumin: 4.2 g/dL (ref 3.6–5.1)
Alkaline phosphatase (APISO): 75 U/L (ref 35–144)
BUN: 13 mg/dL (ref 7–25)
CO2: 26 mmol/L (ref 20–32)
Calcium: 9.6 mg/dL (ref 8.6–10.3)
Chloride: 106 mmol/L (ref 98–110)
Creat: 0.81 mg/dL (ref 0.70–1.33)
GFR, Est African American: 116 mL/min/{1.73_m2} (ref >=60)
GFR, Est Non African American: 100 mL/min/{1.73_m2} (ref >=60)
Globulin: 2.6 g/dL (calc) (ref 1.9–3.7)
Glucose, Bld: 122 mg/dL — ABNORMAL HIGH (ref 65–99)
Potassium: 4.4 mmol/L (ref 3.5–5.3)
Sodium: 138 mmol/L (ref 135–146)
Total Bilirubin: 0.4 mg/dL (ref 0.2–1.2)
Total Protein: 6.8 g/dL (ref 6.1–8.1)

## 2020-04-12 MED ORDER — HYDROCHLOROTHIAZIDE 25 MG PO TABS: 25.0000 mg | ORAL_TABLET | Freq: Every day | ORAL | 1 refills | Status: DC

## 2020-04-12 NOTE — Progress Notes (Signed)
Established Patient Office Visit  Subjective:  Patient ID: Paul Osborn, male    DOB: 1966/12/21  Age: 54 y.o. MRN: [id number]  CC:  Chief Complaint  Patient presents with  . Hypertension    HPI [name] presents for   Hypertension- Pt denies chest pain, SOB, dizziness, or heart palpitations.  Taking meds as directed w/o problems.  Denies medication side effects.    Hyperlipidemia - tolerating stating well with no myalgias or significant side effects.  Lab Results  Component Value Date   CHOL 224 (H) 08/29/2019   HDL 38 (L) 08/29/2019   LDLCALC 152 (H) 08/29/2019   TRIG 196 (H) 08/29/2019   CHOLHDL 5.9 (H) 08/29/2019   He did get his COVID vaccines.     Past Medical History:  Diagnosis Date  . Depression     Past Surgical History:  Procedure Laterality Date  . correction of overbite     as a child    Family History  Problem Relation Age of Onset  . Hyperlipidemia Mother   . Breast cancer Mother   . Hypertension Mother   . Alzheimer's disease Mother   . Hypertension Father   . Stroke Father   . Diabetes Father   . Colon cancer Maternal Grandmother     Social History   Socioeconomic History  . Marital status: Married    Spouse name: Asa Lente   . Number of children: 2  . Years of education: Not on file  . Highest education level: Not on file  Occupational History  . Occupation: Freight forwarder    Comment: [employer]  Tobacco Use  . Smoking status: Current Every Day Smoker    Packs/day: 1.00  . Smokeless tobacco: Never Used  Substance and Sexual Activity  . Alcohol use: Yes    Comment: rare  . Drug use: No  . Sexual activity: Yes    Partners: Female  Other Topics Concern  . Not on file  Social History Narrative   No regular exercise.    Social Determinants of Health   Financial Resource Strain:   . Difficulty of Paying Living Expenses:   Food Insecurity:   . Worried About Charity fundraiser in the Last Year:   . Arboriculturist in the  Last Year:   Transportation Needs:   . Film/video editor (Medical):   Marland Kitchen Lack of Transportation (Non-Medical):   Physical Activity:   . Days of Exercise per Week:   . Minutes of Exercise per Session:   Stress:   . Feeling of Stress :   Social Connections:   . Frequency of Communication with Friends and Family:   . Frequency of Social Gatherings with Friends and Family:   . Attends Religious Services:   . Active Member of Clubs or Organizations:   . Attends Archivist Meetings:   Marland Kitchen Marital Status:   Intimate Partner Violence:   . Fear of Current or Ex-Partner:   . Emotionally Abused:   Marland Kitchen Physically Abused:   . Sexually Abused:     Outpatient Medications Prior to Visit  Medication Sig Dispense Refill  . atorvastatin (LIPITOR) 20 MG tablet Take 1 tablet (20 mg total) by mouth daily. 90 tablet 3  . hydrochlorothiazide (HYDRODIURIL) 25 MG tablet Take 1 tablet (25 mg total) by mouth daily. 90 tablet 1  . varenicline (CHANTIX CONTINUING MONTH PAK) 1 MG tablet Take 1 tablet (1 mg total) by mouth 2 (two) times daily. 60 tablet  1   No facility-administered medications prior to visit.    No Known Allergies  ROS Review of Systems    Objective:    Physical Exam  Constitutional: He is oriented to person, place, and time. He appears well-developed and well-nourished.  HENT:  Head: Normocephalic and atraumatic.  Cardiovascular: Normal rate, regular rhythm and normal heart sounds.  Pulmonary/Chest: Effort normal and breath sounds normal.  Neurological: He is alert and oriented to person, place, and time.  Skin: Skin is warm and dry.  Psychiatric: He has a normal mood and affect. His behavior is normal.    BP 134/87   Pulse 76   Ht 5\' 9"  (1.753 m)   Wt 212 lb (96.2 kg)   SpO2 98%   BMI 31.31 kg/m  Wt Readings from Last 3 Encounters:  04/12/20 212 lb (96.2 kg)  09/26/19 206 lb (93.4 kg)  08/26/19 209 lb (94.8 kg)     There are no preventive care reminders  to display for this patient.  There are no preventive care reminders to display for this patient.  Lab Results  Component Value Date   TSH 2.64 08/29/2019   Lab Results  Component Value Date   WBC 8.1 08/29/2019   HGB 17.0 08/29/2019   HCT 50.2 (H) 08/29/2019   MCV 88.7 08/29/2019   PLT 406 (H) 08/29/2019   Lab Results  Component Value Date   NA 138 09/26/2019   K 3.6 09/26/2019   CO2 28 09/26/2019   GLUCOSE 85 09/26/2019   BUN 14 09/26/2019   CREATININE 0.97 09/26/2019   BILITOT 0.5 08/29/2019   ALKPHOS 61 11/09/2015   AST 19 08/29/2019   ALT 23 08/29/2019   PROT 7.0 08/29/2019   ALBUMIN 4.2 11/09/2015   CALCIUM 10.3 09/26/2019   Lab Results  Component Value Date   CHOL 224 (H) 08/29/2019   Lab Results  Component Value Date   HDL 38 (L) 08/29/2019   Lab Results  Component Value Date   LDLCALC 152 (H) 08/29/2019   Lab Results  Component Value Date   TRIG 196 (H) 08/29/2019   Lab Results  Component Value Date   CHOLHDL 5.9 (H) 08/29/2019   No results found for: HGBA1C    Assessment & Plan:   Problem List Items Addressed This Visit      Cardiovascular and Mediastinum   Essential hypertension - Primary    Well controlled. Continue current regimen. Follow up in  6 mo      Relevant Medications   hydrochlorothiazide (HYDRODIURIL) 25 MG tablet   Other Relevant Orders   Lipid panel   COMPLETE METABOLIC PANEL WITH GFR     Other   Hyperlipidemia    Tolerating statin well without any side effects or problems.  Due to recheck lipids and liver enzymes.  Will go to the lab today.      Relevant Medications   hydrochlorothiazide (HYDRODIURIL) 25 MG tablet   Other Relevant Orders   Lipid panel   COMPLETE METABOLIC PANEL WITH GFR      Meds ordered this encounter  Medications  . hydrochlorothiazide (HYDRODIURIL) 25 MG tablet    Sig: Take 1 tablet (25 mg total) by mouth daily.    Dispense:  90 tablet    Refill:  1    Follow-up: Return in about 6  months (around 10/12/2020) for Hypertension.    Beatrice Lecher, MD

## 2020-04-12 NOTE — Assessment & Plan Note (Signed)
Tolerating statin well without any side effects or problems.  Due to recheck lipids and liver enzymes.  Will go to the lab today.

## 2020-04-12 NOTE — Assessment & Plan Note (Signed)
Well controlled. Continue current regimen. Follow up in  6 mo  

## 2020-05-08 ENCOUNTER — Ambulatory Visit: Payer: 59

## 2020-10-12 ENCOUNTER — Ambulatory Visit (INDEPENDENT_AMBULATORY_CARE_PROVIDER_SITE_OTHER): Payer: 59 | Admitting: Family Medicine

## 2020-10-12 ENCOUNTER — Encounter: Payer: Self-pay | Admitting: Family Medicine

## 2020-10-12 VITALS — BP 116/88 | HR 88 | Ht 69.0 in | Wt 202.0 lb

## 2020-10-12 DIAGNOSIS — I10 Essential (primary) hypertension: Secondary | ICD-10-CM | POA: Diagnosis not present

## 2020-10-12 DIAGNOSIS — R7309 Other abnormal glucose: Secondary | ICD-10-CM | POA: Diagnosis not present

## 2020-10-12 DIAGNOSIS — Z23 Encounter for immunization: Secondary | ICD-10-CM

## 2020-10-12 DIAGNOSIS — R7301 Impaired fasting glucose: Secondary | ICD-10-CM | POA: Insufficient documentation

## 2020-10-12 DIAGNOSIS — E663 Overweight: Secondary | ICD-10-CM | POA: Insufficient documentation

## 2020-10-12 LAB — POCT GLYCOSYLATED HEMOGLOBIN (HGB A1C): Hemoglobin A1C: 6.5 % — AB (ref 4.0–5.6)

## 2020-10-12 NOTE — Progress Notes (Signed)
Established Patient Office Visit  Subjective:  Patient ID: Paul Osborn, male    DOB: 1966-05-07  Age: 54 y.o. MRN: [id number]  CC:  Chief Complaint  Patient presents with  . Hypertension    HPI Paul Osborn presents for   Hypertension- Pt denies chest pain, SOB, dizziness, or heart palpitations.  Taking meds as directed w/o problems.  Denies medication side effects.    F/U abnormal glucose.  On labs 6 months ago his glucose was elevated.+ fam hx of diabetes in his father.    Did got to [place] since I last saw him and had a wonderful trip!!   Past Medical History:  Diagnosis Date  . Depression     Past Surgical History:  Procedure Laterality Date  . correction of overbite     as a child    Family History  Problem Relation Age of Onset  . Hyperlipidemia Mother   . Breast cancer Mother   . Hypertension Mother   . Alzheimer's disease Mother   . Hypertension Father   . Stroke Father   . Diabetes Father   . Colon cancer Maternal Grandmother     Social History   Socioeconomic History  . Marital status: Married    Spouse name: Asa Lente   . Number of children: 2  . Years of education: Not on file  . Highest education level: Not on file  Occupational History  . Occupation: Freight forwarder    Comment: [employer]  Tobacco Use  . Smoking status: Current Every Day Smoker    Packs/day: 1.00  . Smokeless tobacco: Never Used  Vaping Use  . Vaping Use: Never used  Substance and Sexual Activity  . Alcohol use: Yes    Comment: rare  . Drug use: No  . Sexual activity: Yes    Partners: Female  Other Topics Concern  . Not on file  Social History Narrative   No regular exercise.    Social Determinants of Health   Financial Resource Strain:   . Difficulty of Paying Living Expenses: Not on file  Food Insecurity:   . Worried About Charity fundraiser in the Last Year: Not on file  . Ran Out of Food in the Last Year: Not on file  Transportation Needs:   . Lack of  Transportation (Medical): Not on file  . Lack of Transportation (Non-Medical): Not on file  Physical Activity:   . Days of Exercise per Week: Not on file  . Minutes of Exercise per Session: Not on file  Stress:   . Feeling of Stress : Not on file  Social Connections:   . Frequency of Communication with Friends and Family: Not on file  . Frequency of Social Gatherings with Friends and Family: Not on file  . Attends Religious Services: Not on file  . Active Member of Clubs or Organizations: Not on file  . Attends Archivist Meetings: Not on file  . Marital Status: Not on file  Intimate Partner Violence:   . Fear of Current or Ex-Partner: Not on file  . Emotionally Abused: Not on file  . Physically Abused: Not on file  . Sexually Abused: Not on file    Outpatient Medications Prior to Visit  Medication Sig Dispense Refill  . atorvastatin (LIPITOR) 20 MG tablet Take 1 tablet (20 mg total) by mouth daily. 90 tablet 3  . hydrochlorothiazide (HYDRODIURIL) 25 MG tablet Take 1 tablet (25 mg total) by mouth daily. 90 tablet 1  No facility-administered medications prior to visit.    No Known Allergies  ROS Review of Systems    Objective:    Physical Exam Constitutional:      Appearance: He is well-developed.  HENT:     Head: Normocephalic and atraumatic.  Cardiovascular:     Rate and Rhythm: Normal rate and regular rhythm.     Heart sounds: Normal heart sounds.  Pulmonary:     Effort: Pulmonary effort is normal.     Breath sounds: Normal breath sounds.  Skin:    General: Skin is warm and dry.  Neurological:     Mental Status: He is alert and oriented to person, place, and time.  Psychiatric:        Behavior: Behavior normal.     BP 116/88   Pulse 88   Ht 5\' 9"  (1.753 m)   Wt 202 lb (91.6 kg)   SpO2 98%   BMI 29.83 kg/m  Wt Readings from Last 3 Encounters:  10/12/20 202 lb (91.6 kg)  04/12/20 212 lb (96.2 kg)  09/26/19 206 lb (93.4 kg)     There  are no preventive care reminders to display for this patient.  There are no preventive care reminders to display for this patient.  Lab Results  Component Value Date   TSH 2.64 08/29/2019   Lab Results  Component Value Date   WBC 8.1 08/29/2019   HGB 17.0 08/29/2019   HCT 50.2 (H) 08/29/2019   MCV 88.7 08/29/2019   PLT 406 (H) 08/29/2019   Lab Results  Component Value Date   NA 138 04/12/2020   K 4.4 04/12/2020   CO2 26 04/12/2020   GLUCOSE 122 (H) 04/12/2020   BUN 13 04/12/2020   CREATININE 0.81 04/12/2020   BILITOT 0.4 04/12/2020   ALKPHOS 61 11/09/2015   AST 20 04/12/2020   ALT 33 04/12/2020   PROT 6.8 04/12/2020   ALBUMIN 4.2 11/09/2015   CALCIUM 9.6 04/12/2020   Lab Results  Component Value Date   CHOL 156 04/12/2020   Lab Results  Component Value Date   HDL 38 (L) 04/12/2020   Lab Results  Component Value Date   LDLCALC 95 04/12/2020   Lab Results  Component Value Date   TRIG 131 04/12/2020   Lab Results  Component Value Date   CHOLHDL 4.1 04/12/2020   Lab Results  Component Value Date   HGBA1C 6.5 (A) 10/12/2020      Assessment & Plan:   Problem List Items Addressed This Visit      Cardiovascular and Mediastinum   Essential hypertension - Primary    Well controlled. Continue current regimen. Follow up in  6 mo        Endocrine   IFG (impaired fasting glucose)    New diagnosis.  Because of elevated glucose level on labs back in the spring we did a hemoglobin A1c it was 6.5 today.  He does have a family history of diabetes.  Discussed really cutting back on carbs and sweets and increasing vegetables and lean proteins really working on getting regular exercise.  I want to see him back in 90 days and see how well he is doing with just lifestyle measures.  We discussed that we could consider medication at that point if needed I really think he can get his A1c down to at least in the prediabetes range we will monitor carefully for now.         Other   Overweight with  body mass index (BMI) 25.0-29.9    BMI of 29.  Did discuss the importance of weight loss and reducing insulin resistance and how this can actually help control his blood sugar levels in addition to improving cardiac health.       Other Visit Diagnoses    Abnormal glucose       Relevant Orders   POCT glycosylated hemoglobin (Hb A1C) (Completed)   Need for immunization against influenza       Relevant Orders   Flu Vaccine QUAD 36+ mos IM (Completed)   Need for Zostavax administration       Relevant Orders   Varicella-zoster vaccine IM (Shingrix) (Completed)      No orders of the defined types were placed in this encounter.   Follow-up: Return in about 3 months (around 01/12/2021) for glucose and 2nd shingles vaccine.  .   I spent 30 minutes on the day of the encounter to include pre-visit record review, face-to-face time with the patient and post visit ordering of test.   Beatrice Lecher, MD

## 2020-10-12 NOTE — Assessment & Plan Note (Signed)
Well controlled. Continue current regimen. Follow up in  6 mo  

## 2020-10-12 NOTE — Assessment & Plan Note (Signed)
BMI of 29.  Did discuss the importance of weight loss and reducing insulin resistance and how this can actually help control his blood sugar levels in addition to improving cardiac health.

## 2020-10-12 NOTE — Assessment & Plan Note (Addendum)
New diagnosis.  Because of elevated glucose level on labs back in the spring we did a hemoglobin A1c it was 6.5 today.  He does have a family history of diabetes.  Discussed really cutting back on carbs and sweets and increasing vegetables and lean proteins really working on getting regular exercise.  I want to see him back in 90 days and see how well he is doing with just lifestyle measures.  We discussed that we could consider medication at that point if needed I really think he can get his A1c down to at least in the prediabetes range we will monitor carefully for now.

## 2020-11-26 ENCOUNTER — Ambulatory Visit: Payer: 59 | Attending: Internal Medicine

## 2020-11-26 DIAGNOSIS — Z23 Encounter for immunization: Secondary | ICD-10-CM

## 2020-11-26 NOTE — Progress Notes (Signed)
   Covid-19 Vaccination Clinic  Name:  Paul Osborn    MRN: [id number] DOB: 04-20-1966  11/26/2020  [name] was observed post Covid-19 immunization for 15 minutes without incident. He was provided with Vaccine Information Sheet and instruction to access the V-Safe system.   [name] was instructed to call 911 with any severe reactions post vaccine: Marland Kitchen Difficulty breathing  . Swelling of face and throat  . A fast heartbeat  . A bad rash all over body  . Dizziness and weakness   Immunizations Administered    Name Date Dose VIS Date Route   Pfizer COVID-19 Vaccine 11/26/2020  4:44 PM 0.3 mL 10/13/2020 Intramuscular   Manufacturer: [place]   Lot: [id number]   [facility]: [address]-7

## 2020-12-01 ENCOUNTER — Other Ambulatory Visit: Payer: Self-pay | Admitting: Family Medicine

## 2020-12-03 ENCOUNTER — Other Ambulatory Visit: Payer: Self-pay | Admitting: Family Medicine

## 2021-01-12 ENCOUNTER — Encounter: Payer: Self-pay | Admitting: Family Medicine

## 2021-01-12 ENCOUNTER — Other Ambulatory Visit: Payer: Self-pay

## 2021-01-12 ENCOUNTER — Ambulatory Visit (INDEPENDENT_AMBULATORY_CARE_PROVIDER_SITE_OTHER): Payer: 59 | Admitting: Family Medicine

## 2021-01-12 VITALS — BP 121/88 | HR 90 | Ht 69.0 in | Wt 194.0 lb

## 2021-01-12 DIAGNOSIS — E663 Overweight: Secondary | ICD-10-CM

## 2021-01-12 DIAGNOSIS — Z23 Encounter for immunization: Secondary | ICD-10-CM | POA: Diagnosis not present

## 2021-01-12 DIAGNOSIS — R7301 Impaired fasting glucose: Secondary | ICD-10-CM | POA: Diagnosis not present

## 2021-01-12 DIAGNOSIS — R7309 Other abnormal glucose: Secondary | ICD-10-CM

## 2021-01-12 DIAGNOSIS — E785 Hyperlipidemia, unspecified: Secondary | ICD-10-CM

## 2021-01-12 DIAGNOSIS — I10 Essential (primary) hypertension: Secondary | ICD-10-CM

## 2021-01-12 LAB — POCT GLYCOSYLATED HEMOGLOBIN (HGB A1C): Hemoglobin A1C: 5.6 % (ref 4.0–5.6)

## 2021-01-12 NOTE — Assessment & Plan Note (Signed)
Congratulated him on the weight loss he is down 8 pounds by making some significant dietary changes.  He says he will get more active over the summer.

## 2021-01-12 NOTE — Assessment & Plan Note (Signed)
A1c looks phenomenal today in fact it is actually back down to the normal range at 5.6 he is really made some fantastic changes and congratulated him on that.  Encouraged him to stick with his current regimen since it is working really well.  Also congratulated him on the weight loss.  Plan to follow-up in 6 months for CPE.

## 2021-01-12 NOTE — Assessment & Plan Note (Signed)
Well controlled. Continue current regimen. Follow up in  6 mo  

## 2021-01-12 NOTE — Assessment & Plan Note (Signed)
Due for repeat labs in April but we will plan to have him follow-up in 6 months for CPE and we will get updated labs at that point.

## 2021-01-12 NOTE — Progress Notes (Signed)
Established Patient Office Visit  Subjective:  Patient ID: Paul Osborn, male    DOB: 1966/10/09  Age: 55 y.o. MRN: [id number]  CC:  Chief Complaint  Patient presents with  . Hypertension  . abnormal glucose    HPI Paul Osborn presents for   Follow-up of impaired fasting glucose-when I last saw him in October his A1c had jumped up significantly to 6.5.  He says since then he is made some major dietary changes including cutting out sweetened drinks and sweet tea.  He is also quit snacking on carbs as much.  He has been trying to eat a little bit more fruit.  And has really made some great changes he is even lost a significant amount of weight since he was last here he.  He is down about 8 pounds.  Hypertension- Pt denies chest pain, SOB, dizziness, or heart palpitations.  Taking meds as directed w/o problems.  Denies medication side effects.      Past Medical History:  Diagnosis Date  . Depression     Past Surgical History:  Procedure Laterality Date  . correction of overbite     as a child    Family History  Problem Relation Age of Onset  . Hyperlipidemia Mother   . Breast cancer Mother   . Hypertension Mother   . Alzheimer's disease Mother   . Hypertension Father   . Stroke Father   . Diabetes Father   . Colon cancer Maternal Grandmother     Social History   Socioeconomic History  . Marital status: Married    Spouse name: Paul Osborn   . Number of children: 2  . Years of education: Not on file  . Highest education level: Not on file  Occupational History  . Occupation: Freight forwarder    Comment: [employer]  Tobacco Use  . Smoking status: Current Every Day Smoker    Packs/day: 1.00  . Smokeless tobacco: Never Used  Vaping Use  . Vaping Use: Never used  Substance and Sexual Activity  . Alcohol use: Yes    Comment: rare  . Drug use: No  . Sexual activity: Yes    Partners: Female  Other Topics Concern  . Not on file  Social History Narrative   No  regular exercise.    Social Determinants of Health   Financial Resource Strain: Not on file  Food Insecurity: Not on file  Transportation Needs: Not on file  Physical Activity: Not on file  Stress: Not on file  Social Connections: Not on file  Intimate Partner Violence: Not on file    Outpatient Medications Prior to Visit  Medication Sig Dispense Refill  . atorvastatin (LIPITOR) 20 MG tablet TAKE ONE TABLET BY MOUTH DAILY 90 tablet 0  . hydrochlorothiazide (HYDRODIURIL) 25 MG tablet TAKE ONE TABLET BY MOUTH DAILY 90 tablet 1   No facility-administered medications prior to visit.    No Known Allergies  ROS Review of Systems    Objective:    Physical Exam Constitutional:      Appearance: He is well-developed and well-nourished.  HENT:     Head: Normocephalic and atraumatic.  Cardiovascular:     Rate and Rhythm: Normal rate and regular rhythm.     Heart sounds: Normal heart sounds.  Pulmonary:     Effort: Pulmonary effort is normal.     Breath sounds: Normal breath sounds.  Skin:    General: Skin is warm and dry.  Neurological:     Mental  Status: He is alert and oriented to person, place, and time.  Psychiatric:        Mood and Affect: Mood and affect normal.        Behavior: Behavior normal.     BP 121/88   Pulse 90   Ht 5\' 9"  (1.753 m)   Wt 194 lb (88 kg)   SpO2 99%   BMI 28.65 kg/m  Wt Readings from Last 3 Encounters:  01/12/21 194 lb (88 kg)  10/12/20 202 lb (91.6 kg)  04/12/20 212 lb (96.2 kg)     There are no preventive care reminders to display for this patient.  There are no preventive care reminders to display for this patient.  Lab Results  Component Value Date   TSH 2.64 08/29/2019   Lab Results  Component Value Date   WBC 8.1 08/29/2019   HGB 17.0 08/29/2019   HCT 50.2 (H) 08/29/2019   MCV 88.7 08/29/2019   PLT 406 (H) 08/29/2019   Lab Results  Component Value Date   NA 138 04/12/2020   K 4.4 04/12/2020   CO2 26 04/12/2020    GLUCOSE 122 (H) 04/12/2020   BUN 13 04/12/2020   CREATININE 0.81 04/12/2020   BILITOT 0.4 04/12/2020   ALKPHOS 61 11/09/2015   AST 20 04/12/2020   ALT 33 04/12/2020   PROT 6.8 04/12/2020   ALBUMIN 4.2 11/09/2015   CALCIUM 9.6 04/12/2020   Lab Results  Component Value Date   CHOL 156 04/12/2020   Lab Results  Component Value Date   HDL 38 (L) 04/12/2020   Lab Results  Component Value Date   LDLCALC 95 04/12/2020   Lab Results  Component Value Date   TRIG 131 04/12/2020   Lab Results  Component Value Date   CHOLHDL 4.1 04/12/2020   Lab Results  Component Value Date   HGBA1C 5.6 01/12/2021      Assessment & Plan:   Problem List Items Addressed This Visit      Cardiovascular and Mediastinum   Essential hypertension    Well controlled. Continue current regimen. Follow up in  6 mo         Endocrine   IFG (impaired fasting glucose)    A1c looks phenomenal today in fact it is actually back down to the normal range at 5.6 he is really made some fantastic changes and congratulated him on that.  Encouraged him to stick with his current regimen since it is working really well.  Also congratulated him on the weight loss.  Plan to follow-up in 6 months for CPE.      Relevant Orders   POCT glycosylated hemoglobin (Hb A1C) (Completed)     Other   Overweight with body mass index (BMI) 25.0-29.9    Congratulated him on the weight loss he is down 8 pounds by making some significant dietary changes.  He says he will get more active over the summer.      Hyperlipidemia    Due for repeat labs in April but we will plan to have him follow-up in 6 months for CPE and we will get updated labs at that point.       Other Visit Diagnoses    Abnormal glucose    -  Primary   Relevant Orders   POCT glycosylated hemoglobin (Hb A1C) (Completed)   Need for Zostavax administration       Relevant Orders   Varicella-zoster vaccine IM (Shingrix) (Completed)     Shingles vaccine  series completed today.  No orders of the defined types were placed in this encounter.   Follow-up: Return in about 6 months (around 07/12/2021) for CPE, Wellness Exam.    Beatrice Lecher, MD

## 2021-03-25 IMAGING — DX DG FINGER INDEX 2+V*R*
3 series · 3 of 3 positions shown · non-contrast
Comparison: None.

CLINICAL DATA: Chronic right index finger pain.

EXAM:
RIGHT INDEX FINGER 2+V

[finger ap]
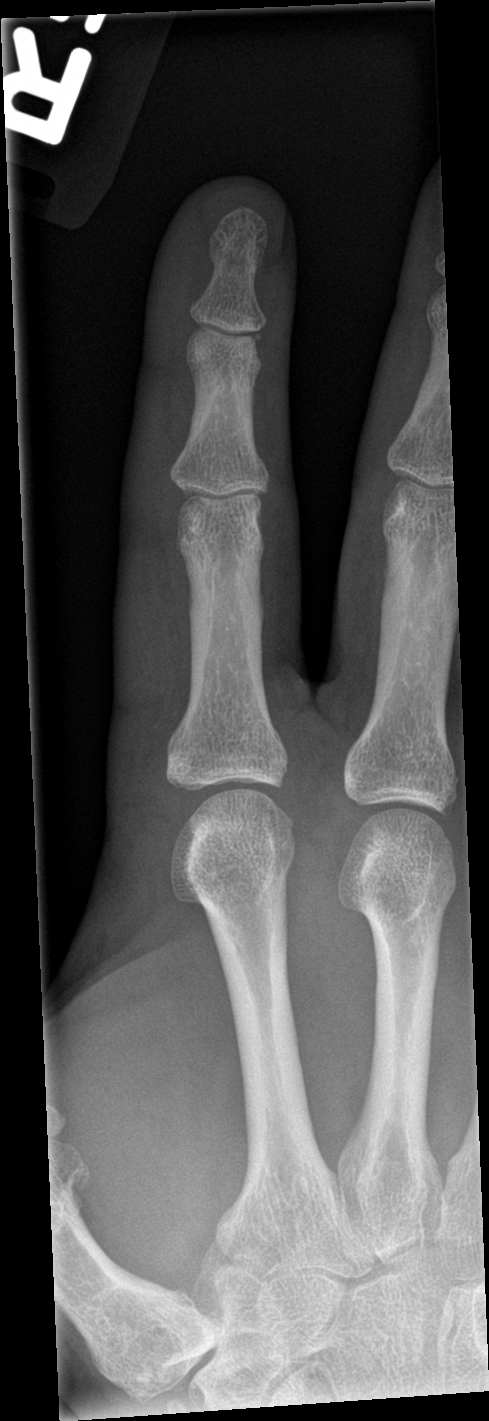

[finger obl]
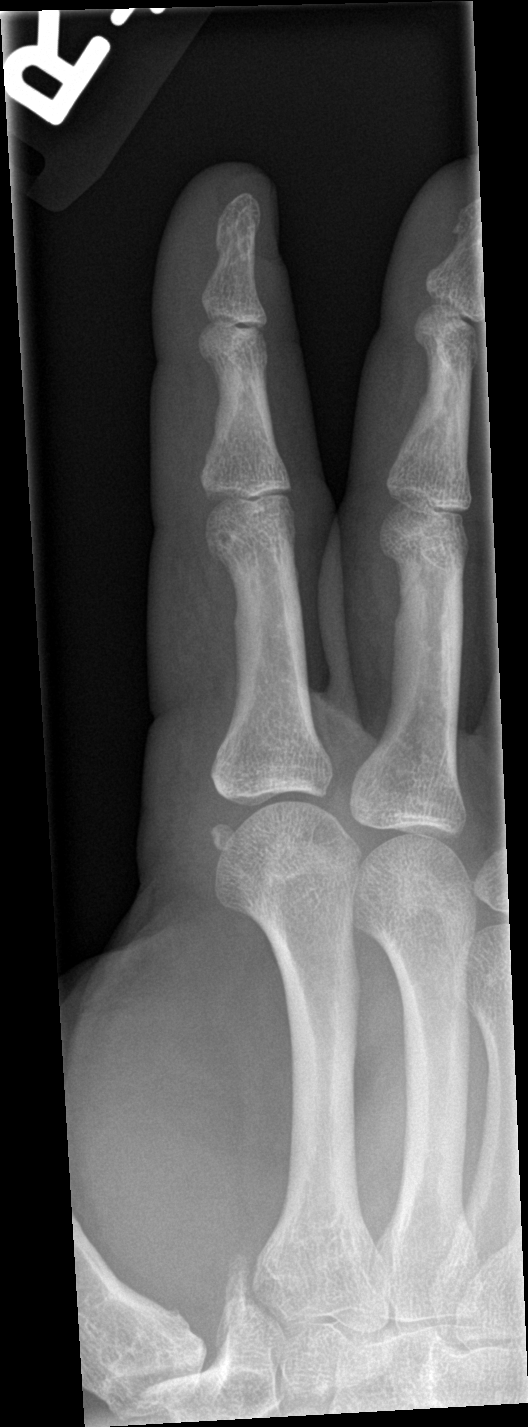

[finger lat]
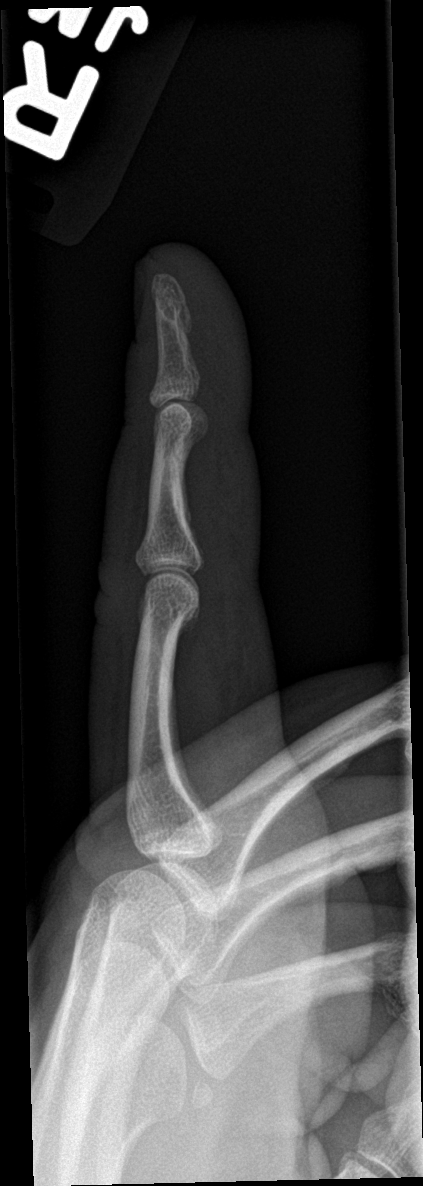

[3 of 3 positions shown; findings below may reference images not displayed]

FINDINGS: There is no evidence of fracture or dislocation. There is no
evidence of arthropathy or other focal bone abnormality. Soft
tissues are unremarkable.
IMPRESSION: Negative.

## 2021-04-01 ENCOUNTER — Other Ambulatory Visit: Payer: Self-pay | Admitting: Family Medicine

## 2021-07-14 ENCOUNTER — Other Ambulatory Visit: Payer: Self-pay

## 2021-07-14 ENCOUNTER — Ambulatory Visit (INDEPENDENT_AMBULATORY_CARE_PROVIDER_SITE_OTHER): Payer: 59 | Admitting: Family Medicine

## 2021-07-14 ENCOUNTER — Encounter: Payer: Self-pay | Admitting: Family Medicine

## 2021-07-14 VITALS — BP 128/80 | HR 84 | Ht 69.0 in | Wt 194.0 lb

## 2021-07-14 DIAGNOSIS — Z0000 Encounter for general adult medical examination without abnormal findings: Secondary | ICD-10-CM

## 2021-07-14 DIAGNOSIS — F17200 Nicotine dependence, unspecified, uncomplicated: Secondary | ICD-10-CM | POA: Diagnosis not present

## 2021-07-14 DIAGNOSIS — Z23 Encounter for immunization: Secondary | ICD-10-CM | POA: Diagnosis not present

## 2021-07-14 MED ORDER — VARENICLINE TARTRATE 1 MG PO TABS: 1.0000 mg | ORAL_TABLET | Freq: Two times a day (BID) | ORAL | 0 refills | Status: DC

## 2021-07-14 MED ORDER — HYDROCHLOROTHIAZIDE 25 MG PO TABS: 25.0000 mg | ORAL_TABLET | Freq: Every day | ORAL | 1 refills | Status: DC

## 2021-07-14 MED ORDER — VARENICLINE TARTRATE 0.5 MG PO TABS: ORAL_TABLET | ORAL | 0 refills | Status: AC

## 2021-07-14 NOTE — Progress Notes (Signed)
Established Patient Office Visit  Subjective:  Patient ID: Paul Osborn, male    DOB: 12-Mar-1966  Age: 55 y.o. MRN: [id number]  CC:  Chief Complaint  Patient presents with   Annual Exam    HPI Paul Osborn presents for Wellness Exam.  He is doing well overall he is interested in quitting smoking.  He does walk about 3 to 4 miles per day at work.  He feels like his mood is in a good place.  Still working full-time at [employer].  He has been a few years since he had an eye exam but plans to get one updated.  Past Medical History:  Diagnosis Date   Depression     Past Surgical History:  Procedure Laterality Date   correction of overbite     as a child    Family History  Problem Relation Age of Onset   Hyperlipidemia Mother    Breast cancer Mother    Hypertension Mother    Alzheimer's disease Mother    Hypertension Father    Stroke Father    Diabetes Father    Colon cancer Maternal Grandmother     Social History   Socioeconomic History   Marital status: Married    Spouse name: Asa Lente    Number of children: 2   Years of education: Not on file   Highest education level: Not on file  Occupational History   Occupation: Freight forwarder    Comment: [employer]  Tobacco Use   Smoking status: Every Day    Packs/day: 1.00    Types: Cigarettes   Smokeless tobacco: Never  Vaping Use   Vaping Use: Never used  Substance and Sexual Activity   Alcohol use: Yes    Comment: rare   Drug use: No   Sexual activity: Yes    Partners: Female  Other Topics Concern   Not on file  Social History Narrative   No regular exercise.    Social Determinants of Health   Financial Resource Strain: Not on file  Food Insecurity: Not on file  Transportation Needs: Not on file  Physical Activity: Not on file  Stress: Not on file  Social Connections: Not on file  Intimate Partner Violence: Not on file    Outpatient Medications Prior to Visit  Medication Sig Dispense Refill    atorvastatin (LIPITOR) 20 MG tablet TAKE ONE TABLET BY MOUTH DAILY 90 tablet 0   hydrochlorothiazide (HYDRODIURIL) 25 MG tablet TAKE ONE TABLET BY MOUTH DAILY 90 tablet 1   No facility-administered medications prior to visit.    No Known Allergies  ROS Review of Systems    Objective:    Physical Exam  BP 128/80   Pulse 84   Ht 5\' 9"  (1.753 m)   Wt 194 lb (88 kg)   SpO2 98%   BMI 28.65 kg/m  Wt Readings from Last 3 Encounters:  07/14/21 194 lb (88 kg)  01/12/21 194 lb (88 kg)  10/12/20 202 lb (91.6 kg)     Health Maintenance Due  Topic Date Due   Pneumococcal Vaccine 29-93 Years old (1 - PCV) Never done   COVID-19 Vaccine (4 - Booster for Pfizer series) 03/27/2021   COLONOSCOPY (Pts 45-72yrs Insurance coverage will need to be confirmed)  04/05/2021    There are no preventive care reminders to display for this patient.  Lab Results  Component Value Date   TSH 2.64 08/29/2019   Lab Results  Component Value Date   WBC 8.1  08/29/2019   HGB 17.0 08/29/2019   HCT 50.2 (H) 08/29/2019   MCV 88.7 08/29/2019   PLT 406 (H) 08/29/2019   Lab Results  Component Value Date   NA 138 04/12/2020   K 4.4 04/12/2020   CO2 26 04/12/2020   GLUCOSE 122 (H) 04/12/2020   BUN 13 04/12/2020   CREATININE 0.81 04/12/2020   BILITOT 0.4 04/12/2020   ALKPHOS 61 11/09/2015   AST 20 04/12/2020   ALT 33 04/12/2020   PROT 6.8 04/12/2020   ALBUMIN 4.2 11/09/2015   CALCIUM 9.6 04/12/2020   Lab Results  Component Value Date   CHOL 156 04/12/2020   Lab Results  Component Value Date   HDL 38 (L) 04/12/2020   Lab Results  Component Value Date   LDLCALC 95 04/12/2020   Lab Results  Component Value Date   TRIG 131 04/12/2020   Lab Results  Component Value Date   CHOLHDL 4.1 04/12/2020   Lab Results  Component Value Date   HGBA1C 5.6 01/12/2021      Assessment & Plan:   Problem List Items Addressed This Visit   None Visit Diagnoses     Wellness examination    -   Primary   Relevant Orders   PSA   Lipid panel   COMPLETE METABOLIC PANEL WITH GFR   CBC   Hemoglobin A1c   Smoker       Relevant Medications   varenicline (CHANTIX) 0.5 MG tablet   varenicline (CHANTIX) 1 MG tablet      Keep up a regular exercise program and make sure you are eating a healthy diet Try to eat 4 servings of dairy a day, or if you are lactose intolerant take a calcium with vitamin D daily.  Your vaccines are up to date.  Smoker - given Prevnar 20.    Smoker -discussed smoking cessation he is interested in trying to quit he has used Chantix in the past and felt it was helpful he says the biggest issue is that his wife has not quit at the same time.  But he says now they are both motivated to quit at the same time.  So new Chantix prescription sent to pharmacy.  Meds ordered this encounter  Medications   hydrochlorothiazide (HYDRODIURIL) 25 MG tablet    Sig: Take 1 tablet (25 mg total) by mouth daily.    Dispense:  90 tablet    Refill:  1   varenicline (CHANTIX) 0.5 MG tablet    Sig: Take 1 tablet (0.5 mg total) by mouth daily for 3 days, THEN 1 tablet (0.5 mg total) 2 (two) times daily for 4 days.    Dispense:  11 tablet    Refill:  0   varenicline (CHANTIX) 1 MG tablet    Sig: Take 1 tablet (1 mg total) by mouth 2 (two) times daily. Start after complete the 0.5mg  dose    Dispense:  90 tablet    Refill:  0    Follow-up: Return in about 6 months (around 01/14/2022) for Hypertension.    Beatrice Lecher, MD

## 2021-07-14 NOTE — Addendum Note (Signed)
Addended by: Teddy Spike on: 07/14/2021 04:43 PM   Modules accepted: Orders

## 2021-07-15 LAB — CBC
HCT: 50.4 % — ABNORMAL HIGH (ref 38.5–50.0)
Hemoglobin: 17.4 g/dL — ABNORMAL HIGH (ref 13.2–17.1)
MCH: 30.4 pg (ref 27.0–33.0)
MCHC: 34.5 g/dL (ref 32.0–36.0)
MCV: 88 fL (ref 80.0–100.0)
MPV: 9.7 fL (ref 7.5–12.5)
Platelets: 382 10*3/uL (ref 140–400)
RBC: 5.73 10*6/uL (ref 4.20–5.80)
RDW: 12.9 % (ref 11.0–15.0)
WBC: 11.6 10*3/uL — ABNORMAL HIGH (ref 3.8–10.8)

## 2021-07-15 LAB — COMPLETE METABOLIC PANEL WITH GFR
AG Ratio: 1.7 (calc) (ref 1.0–2.5)
ALT: 33 U/L (ref 9–46)
AST: 20 U/L (ref 10–35)
Albumin: 4.5 g/dL (ref 3.6–5.1)
Alkaline phosphatase (APISO): 73 U/L (ref 35–144)
BUN: 19 mg/dL (ref 7–25)
CO2: 27 mmol/L (ref 20–32)
Calcium: 10.3 mg/dL (ref 8.6–10.3)
Chloride: 102 mmol/L (ref 98–110)
Creat: 1.03 mg/dL (ref 0.70–1.30)
Globulin: 2.6 g/dL (calc) (ref 1.9–3.7)
Glucose, Bld: 114 mg/dL — ABNORMAL HIGH (ref 65–99)
Potassium: 4.1 mmol/L (ref 3.5–5.3)
Sodium: 138 mmol/L (ref 135–146)
Total Bilirubin: 0.5 mg/dL (ref 0.2–1.2)
Total Protein: 7.1 g/dL (ref 6.1–8.1)
eGFR: 85 mL/min/{1.73_m2} (ref >=60)

## 2021-07-15 LAB — LIPID PANEL
Cholesterol: 179 mg/dL (ref <200)
HDL: 41 mg/dL (ref >=40)
LDL Cholesterol (Calc): 116 mg/dL (calc) — ABNORMAL HIGH
Non-HDL Cholesterol (Calc): 138 mg/dL (calc) — ABNORMAL HIGH (ref <130)
Total CHOL/HDL Ratio: 4.4 (calc) (ref <5.0)
Triglycerides: 109 mg/dL (ref <150)

## 2021-07-15 LAB — PSA: PSA: 0.53 ng/mL (ref <=4.00)

## 2021-07-15 LAB — HEMOGLOBIN A1C
Hgb A1c MFr Bld: 6 % of total Hgb — ABNORMAL HIGH (ref <5.7)
Mean Plasma Glucose: 126 mg/dL
eAG (mmol/L): 7 mmol/L

## 2021-07-18 ENCOUNTER — Telehealth: Payer: Self-pay | Admitting: Family Medicine

## 2021-07-18 MED ORDER — ATORVASTATIN CALCIUM 40 MG PO TABS: 40.0000 mg | ORAL_TABLET | Freq: Every day | ORAL | 3 refills | Status: DC

## 2021-07-18 NOTE — Telephone Encounter (Signed)
-----   Message from Jorja Loa, [place] sent at 07/18/2021 11:34 AM EDT ----- Patient aware of results. He is okay with increasing the Atorvastatin. Please send to Kristopher Oppenheim in chart.

## 2021-07-18 NOTE — Telephone Encounter (Signed)
Meds ordered this encounter  Medications   atorvastatin (LIPITOR) 40 MG tablet    Sig: Take 1 tablet (40 mg total) by mouth at bedtime.    Dispense:  90 tablet    Refill:  3

## 2021-07-25 ENCOUNTER — Encounter: Payer: Self-pay | Admitting: Gastroenterology

## 2022-01-16 ENCOUNTER — Other Ambulatory Visit: Payer: Self-pay

## 2022-01-16 ENCOUNTER — Telehealth: Payer: Self-pay

## 2022-01-16 ENCOUNTER — Encounter: Payer: Self-pay | Admitting: Family Medicine

## 2022-01-16 ENCOUNTER — Ambulatory Visit (INDEPENDENT_AMBULATORY_CARE_PROVIDER_SITE_OTHER): Payer: 59 | Admitting: Family Medicine

## 2022-01-16 VITALS — BP 149/100 | HR 87 | Resp 16 | Ht 69.0 in | Wt 197.0 lb

## 2022-01-16 DIAGNOSIS — I10 Essential (primary) hypertension: Secondary | ICD-10-CM

## 2022-01-16 DIAGNOSIS — F17200 Nicotine dependence, unspecified, uncomplicated: Secondary | ICD-10-CM

## 2022-01-16 DIAGNOSIS — R7301 Impaired fasting glucose: Secondary | ICD-10-CM | POA: Diagnosis not present

## 2022-01-16 LAB — POCT GLYCOSYLATED HEMOGLOBIN (HGB A1C): Hemoglobin A1C: 5.7 % — AB (ref 4.0–5.6)

## 2022-01-16 MED ORDER — HYDROCHLOROTHIAZIDE 25 MG PO TABS: 25.0000 mg | ORAL_TABLET | Freq: Every day | ORAL | 1 refills | Status: DC

## 2022-01-16 NOTE — Progress Notes (Signed)
Established Patient Office Visit  Subjective:  Patient ID: Paul Osborn, male    DOB: October 15, 1966  Age: 56 y.o. MRN: [id number]  CC:  Chief Complaint  Patient presents with   Hypertension    Follow up     HPI Paul Osborn presents for   Hypertension- Pt denies chest pain, SOB, dizziness, or heart palpitations.  Taking meds as directed w/o problems.  Denies medication side effects.    Impaired fasting glucose-no increased thirst or urination. No symptoms consistent with hypoglycemia.   He is planning on going back to [place] this year with his son and doing a long hike.  So he will be starting to exercise little bit more consistently with walking.  His insurance did not cover the Chantix.  They told him it would need a prior authorization.  Past Medical History:  Diagnosis Date   Depression     Past Surgical History:  Procedure Laterality Date   correction of overbite     as a child    Family History  Problem Relation Age of Onset   Hyperlipidemia Mother    Breast cancer Mother    Hypertension Mother    Alzheimer's disease Mother    Hypertension Father    Stroke Father    Diabetes Father    Colon cancer Maternal Grandmother     Social History   Socioeconomic History   Marital status: Married    Spouse name: Asa Lente    Number of children: 2   Years of education: Not on file   Highest education level: Not on file  Occupational History   Occupation: Freight forwarder    Comment: [employer]  Tobacco Use   Smoking status: Every Day    Packs/day: 1.00    Types: Cigarettes   Smokeless tobacco: Never   Tobacco comments:    1 pack a day   Vaping Use   Vaping Use: Never used  Substance and Sexual Activity   Alcohol use: Yes    Comment: rare   Drug use: No   Sexual activity: Yes    Partners: Female  Other Topics Concern   Not on file  Social History Narrative   No regular exercise.    Social Determinants of Health   Financial Resource Strain: Not on file   Food Insecurity: Not on file  Transportation Needs: Not on file  Physical Activity: Not on file  Stress: Not on file  Social Connections: Not on file  Intimate Partner Violence: Not on file    Outpatient Medications Prior to Visit  Medication Sig Dispense Refill   atorvastatin (LIPITOR) 40 MG tablet Take 1 tablet (40 mg total) by mouth at bedtime. 90 tablet 3   hydrochlorothiazide (HYDRODIURIL) 25 MG tablet Take 1 tablet (25 mg total) by mouth daily. 90 tablet 1   varenicline (CHANTIX) 1 MG tablet Take 1 tablet (1 mg total) by mouth 2 (two) times daily. Start after complete the 0.96m dose 90 tablet 0   No facility-administered medications prior to visit.    No Known Allergies  ROS Review of Systems    Objective:    Physical Exam Constitutional:      Appearance: Normal appearance. He is well-developed.  HENT:     Head: Normocephalic and atraumatic.  Cardiovascular:     Rate and Rhythm: Normal rate and regular rhythm.     Heart sounds: Normal heart sounds.  Pulmonary:     Effort: Pulmonary effort is normal.     Breath  sounds: Normal breath sounds.  Skin:    General: Skin is warm and dry.  Neurological:     Mental Status: He is alert and oriented to person, place, and time. Mental status is at baseline.  Psychiatric:        Behavior: Behavior normal.    BP (!) 149/100 (BP Location: Right Arm)    Pulse 87    Resp 16    Ht $R'5\' 9"'Yq$  (1.753 m)    Wt 197 lb (89.4 kg)    SpO2 97%    BMI 29.09 kg/m  Wt Readings from Last 3 Encounters:  01/16/22 197 lb (89.4 kg)  07/14/21 194 lb (88 kg)  01/12/21 194 lb (88 kg)     Health Maintenance Due  Topic Date Due   COLONOSCOPY (Pts 45-62yrs Insurance coverage will need to be confirmed)  04/05/2021   INFLUENZA VACCINE  07/25/2021    There are no preventive care reminders to display for this patient.  Lab Results  Component Value Date   TSH 2.64 08/29/2019   Lab Results  Component Value Date   WBC 11.6 (H) 07/14/2021   HGB  17.4 (H) 07/14/2021   HCT 50.4 (H) 07/14/2021   MCV 88.0 07/14/2021   PLT 382 07/14/2021   Lab Results  Component Value Date   NA 138 07/14/2021   K 4.1 07/14/2021   CO2 27 07/14/2021   GLUCOSE 114 (H) 07/14/2021   BUN 19 07/14/2021   CREATININE 1.03 07/14/2021   BILITOT 0.5 07/14/2021   ALKPHOS 61 11/09/2015   AST 20 07/14/2021   ALT 33 07/14/2021   PROT 7.1 07/14/2021   ALBUMIN 4.2 11/09/2015   CALCIUM 10.3 07/14/2021   EGFR 85 07/14/2021   Lab Results  Component Value Date   CHOL 179 07/14/2021   Lab Results  Component Value Date   HDL 41 07/14/2021   Lab Results  Component Value Date   LDLCALC 116 (H) 07/14/2021   Lab Results  Component Value Date   TRIG 109 07/14/2021   Lab Results  Component Value Date   CHOLHDL 4.4 07/14/2021   Lab Results  Component Value Date   HGBA1C 5.7 (A) 01/16/2022      Assessment & Plan:   Problem List Items Addressed This Visit       Cardiovascular and Mediastinum   Essential hypertension - Primary    Initial l pressure a little high today though he did have a lot of caffeine and he was taking some decongestant cold medication over the weekend.  Normally blood pressure is better controlled.  So for now we will just continue to monitor.  If is elevated at the next visit then we will need to make adjustments to his regimen.      Relevant Medications   hydrochlorothiazide (HYDRODIURIL) 25 MG tablet     Endocrine   IFG (impaired fasting glucose)    A1C looks great today.  Continue to work on healthy diet and regular exercise.      Relevant Orders   POCT HgB A1C (Completed)     Other   TOBACCO DEPENDENCE    Was unable to get the Chantix because his insurance requires a prior authorization but he is not still interested in quitting he has been mostly relying on nicotine patches lately.       Meds ordered this encounter  Medications   hydrochlorothiazide (HYDRODIURIL) 25 MG tablet    Sig: Take 1 tablet (25 mg  total) by mouth daily.  Dispense:  90 tablet    Refill:  1    Follow-up: Return in about 6 months (around 07/16/2022) for hypertension follow. 2 week Nurse Visit for BP recheck .    Beatrice Lecher, MD

## 2022-01-16 NOTE — Assessment & Plan Note (Signed)
Was unable to get the Chantix because his insurance requires a prior authorization but he is not still interested in quitting he has been mostly relying on nicotine patches lately.

## 2022-01-16 NOTE — Progress Notes (Signed)
Patient states he will call to schedule Colonoscopy with [facility].

## 2022-01-16 NOTE — Assessment & Plan Note (Signed)
A1C looks great today.  Continue to work on healthy diet and regular exercise.

## 2022-01-16 NOTE — Telephone Encounter (Signed)
Patient was seen in office today and stated is is unable to get his Chantix pharmacy stating prescription requires [place]. Forward to [employer]

## 2022-01-16 NOTE — Assessment & Plan Note (Addendum)
Initial l pressure a little high today though he did have a lot of caffeine and he was taking some decongestant cold medication over the weekend.  Normally blood pressure is better controlled.  So for now we will just continue to monitor.  If is elevated at the next visit then we will need to make adjustments to his regimen.

## 2022-01-27 ENCOUNTER — Ambulatory Visit (INDEPENDENT_AMBULATORY_CARE_PROVIDER_SITE_OTHER): Payer: 59 | Admitting: Family Medicine

## 2022-01-27 ENCOUNTER — Other Ambulatory Visit: Payer: Self-pay

## 2022-01-27 VITALS — BP 135/85 | HR 81 | Resp 20

## 2022-01-27 DIAGNOSIS — I10 Essential (primary) hypertension: Secondary | ICD-10-CM

## 2022-01-27 NOTE — Progress Notes (Signed)
Established Patient Office Visit  Subjective:  Patient ID: Paul Osborn, male    DOB: Feb 02, 1966  Age: 56 y.o. MRN: [id number]  CC:  Chief Complaint  Patient presents with   Hypertension    HPI Paul Osborn presents for Blood pressure check. Denies Chest pains, SOB, and dizziness.  Past Medical History:  Diagnosis Date   Depression     Past Surgical History:  Procedure Laterality Date   correction of overbite     as a child    Family History  Problem Relation Age of Onset   Hyperlipidemia Mother    Breast cancer Mother    Hypertension Mother    Alzheimer's disease Mother    Hypertension Father    Stroke Father    Diabetes Father    Colon cancer Maternal Grandmother     Social History   Socioeconomic History   Marital status: Married    Spouse name: Asa Lente    Number of children: 2   Years of education: Not on file   Highest education level: Not on file  Occupational History   Occupation: Freight forwarder    Comment: [employer]  Tobacco Use   Smoking status: Every Day    Packs/day: 1.00    Types: Cigarettes   Smokeless tobacco: Never   Tobacco comments:    1 pack a day   Vaping Use   Vaping Use: Never used  Substance and Sexual Activity   Alcohol use: Yes    Comment: rare   Drug use: No   Sexual activity: Yes    Partners: Female  Other Topics Concern   Not on file  Social History Narrative   No regular exercise.    Social Determinants of Health   Financial Resource Strain: Not on file  Food Insecurity: Not on file  Transportation Needs: Not on file  Physical Activity: Not on file  Stress: Not on file  Social Connections: Not on file  Intimate Partner Violence: Not on file    Outpatient Medications Prior to Visit  Medication Sig Dispense Refill   atorvastatin (LIPITOR) 40 MG tablet Take 1 tablet (40 mg total) by mouth at bedtime. 90 tablet 3   hydrochlorothiazide (HYDRODIURIL) 25 MG tablet Take 1 tablet (25 mg total) by mouth daily. 90  tablet 1   No facility-administered medications prior to visit.    No Known Allergies  ROS Review of Systems    Objective:    Physical Exam  BP 135/85 (BP Location: Left Arm, Patient Position: Sitting, Cuff Size: Large)    Pulse 81    Resp 20    SpO2 97%  Wt Readings from Last 3 Encounters:  01/16/22 197 lb (89.4 kg)  07/14/21 194 lb (88 kg)  01/12/21 194 lb (88 kg)     Health Maintenance Due  Topic Date Due   COLONOSCOPY (Pts 45-50yr Insurance coverage will need to be confirmed)  04/05/2021   INFLUENZA VACCINE  07/25/2021    There are no preventive care reminders to display for this patient.  Lab Results  Component Value Date   TSH 2.64 08/29/2019   Lab Results  Component Value Date   WBC 11.6 (H) 07/14/2021   HGB 17.4 (H) 07/14/2021   HCT 50.4 (H) 07/14/2021   MCV 88.0 07/14/2021   PLT 382 07/14/2021   Lab Results  Component Value Date   NA 138 07/14/2021   K 4.1 07/14/2021   CO2 27 07/14/2021   GLUCOSE 114 (H) 07/14/2021  BUN 19 07/14/2021  ° CREATININE 1.03 07/14/2021  ° BILITOT 0.5 07/14/2021  ° ALKPHOS 61 11/09/2015  ° AST 20 07/14/2021  ° ALT 33 07/14/2021  ° PROT 7.1 07/14/2021  ° ALBUMIN 4.2 11/09/2015  ° CALCIUM 10.3 07/14/2021  ° EGFR 85 07/14/2021  ° °Lab Results  °Component Value Date  ° CHOL 179 07/14/2021  ° °Lab Results  °Component Value Date  ° HDL 41 07/14/2021  ° °Lab Results  °Component Value Date  ° LDLCALC 116 (H) 07/14/2021  ° °Lab Results  °Component Value Date  ° TRIG 109 07/14/2021  ° °Lab Results  °Component Value Date  ° CHOLHDL 4.4 07/14/2021  ° °Lab Results  °Component Value Date  ° HGBA1C 5.7 (A) 01/16/2022  ° ° °  °Assessment & Plan:  °The provider would like for the BP Goal to be 130/80. Dr. Metheney is changing medication to Lisinopril HTCZ. Patietn advised to to schedule a follow up with the provider in 1 month. °Problem List Items Addressed This Visit   °None ° ° °No orders of the defined types were placed in this  encounter. ° ° °Follow-up: Return in about 1 month (around 02/24/2022) for Hypertension.  ° ° °Carol L Scribner, CMA °

## 2022-01-30 NOTE — Progress Notes (Signed)
Agree with documentation as above.   Vear Staton, MD  

## 2022-07-13 ENCOUNTER — Ambulatory Visit: Payer: 59 | Admitting: Family Medicine

## 2022-10-09 ENCOUNTER — Other Ambulatory Visit: Payer: Self-pay | Admitting: Family Medicine

## 2022-10-09 DIAGNOSIS — I10 Essential (primary) hypertension: Secondary | ICD-10-CM

## 2023-04-11 ENCOUNTER — Ambulatory Visit (INDEPENDENT_AMBULATORY_CARE_PROVIDER_SITE_OTHER): Payer: 59 | Admitting: Family Medicine

## 2023-04-11 ENCOUNTER — Encounter: Payer: Self-pay | Admitting: Family Medicine

## 2023-04-11 VITALS — BP 124/82 | HR 77 | Ht 69.0 in | Wt 191.0 lb

## 2023-04-11 DIAGNOSIS — Z125 Encounter for screening for malignant neoplasm of prostate: Secondary | ICD-10-CM

## 2023-04-11 DIAGNOSIS — Z23 Encounter for immunization: Secondary | ICD-10-CM | POA: Diagnosis not present

## 2023-04-11 DIAGNOSIS — Z1211 Encounter for screening for malignant neoplasm of colon: Secondary | ICD-10-CM

## 2023-04-11 DIAGNOSIS — Z0000 Encounter for general adult medical examination without abnormal findings: Secondary | ICD-10-CM | POA: Diagnosis not present

## 2023-04-11 NOTE — Progress Notes (Signed)
Complete physical exam  Patient: Paul Osborn   DOB: Oct 28, 1966   57 y.o. Male  MRN: [id number]  Subjective:    Chief Complaint  Patient presents with   Annual Exam    Paul Osborn is a 57 y.o. male who presents today for a complete physical exam. He reports consuming a general diet.  Walks at least 4 miles per day at work. Also hikes  He generally feels well.  He does not have additional problems to discuss today.     Most recent fall risk assessment:    04/11/2023   10:06 AM  Fall Risk   Falls in the past year? 0  Number falls in past yr: 0  Injury with Fall? 0  Risk for fall due to : No Fall Risks  Follow up Falls evaluation completed     Most recent depression screenings:    04/11/2023   10:06 AM 01/16/2022    9:29 AM  PHQ 2/9 Scores  PHQ - 2 Score 0 0        Patient Care Team: Agapito Games, MD as PCP - General   Outpatient Medications Prior to Visit  Medication Sig   atorvastatin (LIPITOR) 40 MG tablet TAKE ONE TABLET BY MOUTH EVERY NIGHT AT BEDTIME   hydrochlorothiazide (HYDRODIURIL) 25 MG tablet TAKE ONE TABLET BY MOUTH DAILY   No facility-administered medications prior to visit.    ROS        Objective:     BP 124/82   Pulse 77   Ht  (1.753 m)   Wt 191 lb (86.6 kg)   SpO2 99%   BMI 28.21 kg/m    Physical Exam Constitutional:      Appearance: He is well-developed.  HENT:     Head: Normocephalic and atraumatic.     Right Ear: Tympanic membrane, ear canal and external ear normal.     Left Ear: Ear canal and external ear normal.     Ears:     Comments: Left TM blocked by cerumen     Nose: Nose normal.     Mouth/Throat:     Pharynx: Oropharynx is clear.  Eyes:     Conjunctiva/sclera: Conjunctivae normal.     Pupils: Pupils are equal, round, and reactive to light.  Neck:     Thyroid: No thyromegaly.  Cardiovascular:     Rate and Rhythm: Normal rate and regular rhythm.     Heart sounds: Normal heart sounds.   Pulmonary:     Effort: Pulmonary effort is normal.     Breath sounds: Normal breath sounds.  Abdominal:     General: Bowel sounds are normal. There is no distension.     Palpations: Abdomen is soft. There is no mass.     Tenderness: There is no abdominal tenderness. There is no guarding or rebound.  Musculoskeletal:        General: Normal range of motion.     Cervical back: Normal range of motion and neck supple. No tenderness.  Lymphadenopathy:     Cervical: No cervical adenopathy.  Skin:    General: Skin is warm and dry.  Neurological:     Mental Status: He is alert and oriented to person, place, and time.     Deep Tendon Reflexes: Reflexes are normal and symmetric.  Psychiatric:        Behavior: Behavior normal.        Thought Content: Thought content normal.  Judgment: Judgment normal.      No results found for any visits on 04/11/23.     Assessment & Plan:    Routine Health Maintenance and Physical Exam  Immunization History  Administered Date(s) Administered   Influenza,inj,Quad PF,6+ Mos 08/26/2019, 10/12/2020   PFIZER(Purple Top)SARS-COV-2 Vaccination 02/29/2020, 03/31/2020, 11/26/2020   PNEUMOCOCCAL CONJUGATE-20 07/14/2021   Tdap 09/13/2012, 04/11/2023   Zoster Recombinat (Shingrix) 10/12/2020, 01/12/2021    Health Maintenance  Topic Date Due   COLONOSCOPY (Pts 45-52yrs Insurance coverage will need to be confirmed)  04/05/2021   COVID-19 Vaccine (4 - 2023-24 season) 08/25/2022   INFLUENZA VACCINE  07/26/2023   DTaP/Tdap/Td (3 - Td or Tdap) 04/10/2033   Pneumococcal Vaccine 13-46 Years old  Completed   Hepatitis C Screening  Completed   HIV Screening  Completed   Zoster Vaccines- Shingrix  Completed   HPV VACCINES  Aged Out    Discussed health benefits of physical activity, and encouraged him to engage in regular exercise appropriate for his age and condition.  Problem List Items Addressed This Visit   None Visit Diagnoses     Wellness  examination    -  Primary   Relevant Orders   Ambulatory referral to Gastroenterology   Lipid Panel w/reflex Direct LDL   COMPLETE METABOLIC PANEL WITH GFR   HgB J4N   PSA   Screening for malignant neoplasm of colon       Relevant Orders   Ambulatory referral to Gastroenterology   Screening for prostate cancer       Relevant Orders   PSA       Keep up a regular exercise program and make sure you are eating a healthy diet Try to eat 4 servings of dairy a day, or if you are lactose intolerant take a calcium with vitamin D daily.  Your vaccines are up to date.   No follow-ups on file.     Nani Gasser, MD

## 2023-04-12 LAB — LIPID PANEL W/REFLEX DIRECT LDL
Cholesterol: 147 mg/dL (ref <200)
HDL: 46 mg/dL (ref >=40)
LDL Cholesterol (Calc): 81 mg/dL (calc)
Non-HDL Cholesterol (Calc): 101 mg/dL (calc) (ref <130)
Total CHOL/HDL Ratio: 3.2 (calc) (ref <5.0)
Triglycerides: 102 mg/dL (ref <150)

## 2023-04-12 LAB — COMPLETE METABOLIC PANEL WITH GFR
AG Ratio: 1.7 (calc) (ref 1.0–2.5)
ALT: 27 U/L (ref 9–46)
AST: 21 U/L (ref 10–35)
Albumin: 4.5 g/dL (ref 3.6–5.1)
Alkaline phosphatase (APISO): 71 U/L (ref 35–144)
BUN: 13 mg/dL (ref 7–25)
CO2: 22 mmol/L (ref 20–32)
Calcium: 10.1 mg/dL (ref 8.6–10.3)
Chloride: 105 mmol/L (ref 98–110)
Creat: 0.88 mg/dL (ref 0.70–1.30)
Globulin: 2.6 g/dL (calc) (ref 1.9–3.7)
Glucose, Bld: 82 mg/dL (ref 65–99)
Potassium: 4.5 mmol/L (ref 3.5–5.3)
Sodium: 137 mmol/L (ref 135–146)
Total Bilirubin: 0.4 mg/dL (ref 0.2–1.2)
Total Protein: 7.1 g/dL (ref 6.1–8.1)
eGFR: 100 mL/min/{1.73_m2} (ref >=60)

## 2023-04-12 LAB — PSA: PSA: 0.81 ng/mL (ref <=4.00)

## 2023-04-12 LAB — HEMOGLOBIN A1C
Hgb A1c MFr Bld: 6.2 % of total Hgb — ABNORMAL HIGH (ref <5.7)
Mean Plasma Glucose: 131 mg/dL
eAG (mmol/L): 7.3 mmol/L

## 2023-04-13 NOTE — Progress Notes (Signed)
Matt, A1c did go up to 6.2.  It was 5.7 last year.  It still in the prediabetes range but is trending upward.  Just encouraged her to continue to work on healthy diet and regular exercise.  Your LDL looks much better.  Down to 43 which is great.  Prostate test and metabolic panel are normal.

## 2023-04-26 ENCOUNTER — Other Ambulatory Visit: Payer: Self-pay | Admitting: Family Medicine

## 2023-04-26 DIAGNOSIS — I10 Essential (primary) hypertension: Secondary | ICD-10-CM

## 2023-11-02 ENCOUNTER — Other Ambulatory Visit: Payer: Self-pay | Admitting: Family Medicine

## 2023-11-02 DIAGNOSIS — I10 Essential (primary) hypertension: Secondary | ICD-10-CM

## 2024-04-11 ENCOUNTER — Encounter: Payer: 59 | Admitting: Family Medicine

## 2024-05-17 ENCOUNTER — Other Ambulatory Visit: Payer: Self-pay | Admitting: Family Medicine

## 2024-05-17 DIAGNOSIS — I10 Essential (primary) hypertension: Secondary | ICD-10-CM

## 2024-11-24 ENCOUNTER — Encounter: Admitting: Family Medicine

## 2024-12-05 ENCOUNTER — Other Ambulatory Visit: Payer: Self-pay | Admitting: Family Medicine

## 2024-12-05 DIAGNOSIS — I10 Essential (primary) hypertension: Secondary | ICD-10-CM

## 2024-12-16 ENCOUNTER — Ambulatory Visit (INDEPENDENT_AMBULATORY_CARE_PROVIDER_SITE_OTHER): Admitting: Family Medicine

## 2024-12-16 ENCOUNTER — Other Ambulatory Visit: Payer: Self-pay | Admitting: Family Medicine

## 2024-12-16 VITALS — BP 132/89 | HR 95 | Ht 69.0 in | Wt 198.0 lb

## 2024-12-16 DIAGNOSIS — F17200 Nicotine dependence, unspecified, uncomplicated: Secondary | ICD-10-CM | POA: Diagnosis not present

## 2024-12-16 DIAGNOSIS — Z0000 Encounter for general adult medical examination without abnormal findings: Secondary | ICD-10-CM

## 2024-12-16 DIAGNOSIS — Z23 Encounter for immunization: Secondary | ICD-10-CM

## 2024-12-16 DIAGNOSIS — Z1211 Encounter for screening for malignant neoplasm of colon: Secondary | ICD-10-CM

## 2024-12-16 MED ORDER — VARENICLINE TARTRATE 1 MG PO TABS: ORAL_TABLET | ORAL | 1 refills | Status: DC

## 2024-12-16 NOTE — Progress Notes (Signed)
 "  Complete physical exam  Patient: OSMAN CALZADILLA    DOB: 25-Jul-1966 58 y.o.   MRN: [id number]  Chief Complaint  Patient presents with   Annual Exam    Subjective:    DANTAE MEUNIER is a 58 y.o. male who presents today for a complete physical exam. He reports consuming a general diet. Stay active at work and home. Likes to hike He generally feels well.  He does not have additional problems to discuss today.   Discussed the use of AI scribe software for clinical note transcription with the patient, who gave verbal consent to proceed.  History of Present Illness AZIR MUZYKA Adina is a 58 year old male who presents for an annual physical exam.  Tobacco use disorder - Continues to smoke cigarettes - Previously used varenicline  with good effect but relapsed after 6 months.  - Interested in restarting varenicline ; insurance requires prior authorization for brand name due to side effects with generic version (caused mental changes)  - Wife also smokes; both are considering quitting together  Preventive health maintenance - Plans to return for blood work at a later date - Considering scheduling colonoscopy or other colon cancer screening; last colonoscopy in 2022  Dermatologic findings - History of dry, flaky skin lesions - Significant sun damage on back - No history of skin cancer - Aware of importance of sun protection  Cardiopulmonary symptoms - No recent chest pain - No shortness of breath  Functional status - Works full shifts - Walks approximately three miles per day - Low turnover rate at work, perceived as positive   Most recent fall risk assessment:    12/16/2024    1:57 PM  Fall Risk   Falls in the past year? 0  Number falls in past yr: 0  Injury with Fall? 0  Risk for fall due to : No Fall Risks  Follow up Falls evaluation completed     Most recent depression screenings:    12/16/2024    1:55 PM 04/11/2023   10:06 AM  PHQ 2/9 Scores  PHQ -  2 Score 0 0        Patient Care Team: Alvan Dorothyann BIRCH, MD as PCP - General   ROS    Objective:    BP 132/89   Pulse 95   Ht 5' 9 (1.753 m)   Wt 198 lb (89.8 kg)   SpO2 93%   BMI 29.24 kg/m     Physical Exam Constitutional:      Appearance: Normal appearance.  HENT:     Head: Normocephalic and atraumatic.     Right Ear: Tympanic membrane, ear canal and external ear normal.     Left Ear: Tympanic membrane, ear canal and external ear normal.     Nose: Nose normal.     Mouth/Throat:     Pharynx: Oropharynx is clear.  Eyes:     Extraocular Movements: Extraocular movements intact.     Conjunctiva/sclera: Conjunctivae normal.     Pupils: Pupils are equal, round, and reactive to light.  Neck:     Thyroid: No thyromegaly.  Cardiovascular:     Rate and Rhythm: Normal rate and regular rhythm.  Pulmonary:     Effort: Pulmonary effort is normal.     Breath sounds: Normal breath sounds.  Abdominal:     General: Bowel sounds are normal.     Palpations: Abdomen is soft.     Tenderness: There is no abdominal tenderness.  Musculoskeletal:  General: No swelling.     Cervical back: Neck supple.  Skin:    General: Skin is warm and dry.  Neurological:     Mental Status: He is oriented to person, place, and time.  Psychiatric:        Mood and Affect: Mood normal.        Behavior: Behavior normal.       No results found for any visits on 12/16/24.       Assessment & Plan:    Routine Health Maintenance and Physical Exam Immunization History  Administered Date(s) Administered   Influenza, Seasonal, Injecte, Preservative Fre 12/16/2024   Influenza,inj,Quad PF,6+ Mos 08/26/2019, 10/12/2020   PFIZER(Purple Top)SARS-COV-2 Vaccination 02/29/2020, 03/31/2020, 11/26/2020   PNEUMOCOCCAL CONJUGATE-20 07/14/2021   Tdap 09/13/2012, 04/11/2023   Zoster Recombinant(Shingrix ) 10/12/2020, 01/12/2021    Health Maintenance  Topic Date Due   Colonoscopy  04/05/2021    COVID-19 Vaccine (4 - 2025-26 season) 08/25/2024   DTaP/Tdap/Td (3 - Td or Tdap) 04/10/2033   Pneumococcal Vaccine: 50+ Years  Completed   Influenza Vaccine  Completed   Hepatitis C Screening  Completed   HIV Screening  Completed   Zoster Vaccines- Shingrix   Completed   Hepatitis B Vaccines 19-59 Average Risk  Aged Out   HPV VACCINES  Aged Out   Meningococcal B Vaccine  Aged Out    Discussed health benefits of physical activity, and encouraged him to engage in regular exercise appropriate for his age and condition.  Problem List Items Addressed This Visit       Other   TOBACCO DEPENDENCE   Relevant Medications   varenicline  (CHANTIX ) 1 MG tablet   Other Visit Diagnoses       Wellness examination    -  Primary   Relevant Orders   CMP14+EGFR   Lipid Panel With LDL/HDL Ratio   CBC   HgB J8r     Encounter for immunization       Relevant Orders   Flu vaccine trivalent PF, 6mos and older(Flulaval,Afluria,Fluarix,Fluzone) (Completed)     Screen for colon cancer       Relevant Orders   Ambulatory referral to Gastroenterology       Assessment and Plan Assessment & Plan Nicotine dependence, cigarettes Continues smoking despite previous varenicline  success. Interested in restarting with partner support. Insurance issues with generic version noted. - Prescribed varenicline , noted side effects with generic version for prior authorization. - Encouraged partner support in cessation efforts. - Advised to contact office if no pharmacy response regarding prior authorization.  Seborrheic keratosis Dry, flaky lesions on back consistent with seborrheic keratosis. Significant sun damage noted. - Advised wearing long sleeves and hats for sun protection. - Recommended using sunscreen.  General Health Maintenance Routine health maintenance discussed. Active lifestyle maintained with regular hiking and work-related physical activity. - Ordered blood work for physical examination. -  Ensured referral for colonoscopy is in place. - Advised scheduling colonoscopy appointment with GI specialist.    Return in about 1 year (around 12/16/2025) for Wellness Exam.    Dorothyann Byars, MD [facility] at [facility]    "

## 2024-12-19 NOTE — Telephone Encounter (Signed)
 Routing to covering provider -   Per [place] Prior -   PLEASE SEND RX FOR DAW 1 BRAND CHANTIX , ONLY SEND QTY FOR STARTER PACK #[id number].  Drug Use Conflict Reason: Missing Information/Clarification.

## 2024-12-24 ENCOUNTER — Ambulatory Visit: Payer: Self-pay | Admitting: Family Medicine

## 2024-12-24 ENCOUNTER — Encounter: Payer: Self-pay | Admitting: Family Medicine

## 2024-12-24 LAB — CBC
Hematocrit: 50.4 % (ref 37.5–51.0)
Hemoglobin: 17 g/dL (ref 13.0–17.7)
MCH: 30.7 pg (ref 26.6–33.0)
MCHC: 33.7 g/dL (ref 31.5–35.7)
MCV: 91 fL (ref 79–97)
Platelets: 400 x10E3/uL (ref 150–450)
RBC: 5.54 x10E6/uL (ref 4.14–5.80)
RDW: 12.9 % (ref 11.6–15.4)
WBC: 11 x10E3/uL — ABNORMAL HIGH (ref 3.4–10.8)

## 2024-12-24 LAB — CMP14+EGFR
ALT: 35 IU/L (ref 0–44)
AST: 24 IU/L (ref 0–40)
Albumin: 4.7 g/dL (ref 3.8–4.9)
Alkaline Phosphatase: 96 IU/L (ref 47–123)
BUN/Creatinine Ratio: 17 (ref 10–24)
BUN: 16 mg/dL (ref 8–27)
Bilirubin Total: 0.4 mg/dL (ref 0.0–1.2)
CO2: 24 mmol/L (ref 20–29)
Calcium: 10.6 mg/dL — ABNORMAL HIGH (ref 8.6–10.2)
Chloride: 100 mmol/L (ref 96–106)
Creatinine, Ser: 0.95 mg/dL (ref 0.76–1.27)
Globulin, Total: 2.4 g/dL (ref 1.5–4.5)
Glucose: 108 mg/dL — ABNORMAL HIGH (ref 70–99)
Potassium: 4.7 mmol/L (ref 3.5–5.2)
Sodium: 140 mmol/L (ref 134–144)
Total Protein: 7.1 g/dL (ref 6.0–8.5)
eGFR: 92 mL/min/1.73

## 2024-12-24 LAB — LIPID PANEL WITH LDL/HDL RATIO
Cholesterol, Total: 143 mg/dL (ref 100–199)
HDL: 41 mg/dL
LDL Chol Calc (NIH): 84 mg/dL (ref 0–99)
LDL/HDL Ratio: 2 ratio (ref 0.0–3.6)
Triglycerides: 94 mg/dL (ref 0–149)
VLDL Cholesterol Cal: 18 mg/dL (ref 5–40)

## 2024-12-24 LAB — HEMOGLOBIN A1C
Est. average glucose Bld gHb Est-mCnc: 137 mg/dL
Hgb A1c MFr Bld: 6.4 % — ABNORMAL HIGH (ref 4.8–5.6)

## 2024-12-24 NOTE — Progress Notes (Signed)
 Hi Matt, calcium  level slightly elevated.  Not sure if you are taking any Supplement or not we can keep an eye on that plan to recheck it again in 2 to 3 months.  White blood cell count is just borderline elevated similar to 3 years ago so it looks pretty stable overall no sign of anemia.  Your A1c went up to 6.4, this is a bit of a jump and has been gradually increasing over the last 2 years.  I really want you to consider starting metformin to reduce your glucose levels and continuing to work on cutting back on carbohydrates and sweets and getting regular exercise I definitely want to follow this back up in 90 days. Cholesterol overall looks good

## 2024-12-26 NOTE — Progress Notes (Signed)
 High consumption and intake of calcium  either in foods or supplement can bump that number.  So if he wants to try to just decrease the amount of milk intake a little bit and we can just see if that makes a difference.

## 2025-01-01 ENCOUNTER — Other Ambulatory Visit: Payer: Self-pay | Admitting: Family Medicine

## 2025-01-01 DIAGNOSIS — F17200 Nicotine dependence, unspecified, uncomplicated: Secondary | ICD-10-CM

## 2025-01-02 MED ORDER — CHANTIX 1 MG PO TABS: 1.0000 mg | ORAL_TABLET | Freq: Two times a day (BID) | ORAL | 1 refills | Status: AC

## 2025-01-14 ENCOUNTER — Other Ambulatory Visit: Payer: Self-pay | Admitting: Family Medicine

## 2025-01-14 DIAGNOSIS — I10 Essential (primary) hypertension: Secondary | ICD-10-CM

## 2025-03-24 ENCOUNTER — Ambulatory Visit: Admitting: Family Medicine

## 2025-12-21 ENCOUNTER — Encounter: Admitting: Family Medicine
